# Patient Record
Sex: Male | Born: 1965 | Race: White | Hispanic: No | Marital: Single | Smoking: Current every day smoker
Health system: Southern US, Community
[De-identification: ages and names within clinical notes are randomized; demographics above are authoritative.]

## PROBLEM LIST (undated history)

## (undated) DIAGNOSIS — E039 Hypothyroidism, unspecified: Secondary | ICD-10-CM

## (undated) DIAGNOSIS — M1990 Unspecified osteoarthritis, unspecified site: Secondary | ICD-10-CM

## (undated) DIAGNOSIS — K219 Gastro-esophageal reflux disease without esophagitis: Secondary | ICD-10-CM

## (undated) DIAGNOSIS — Z720 Tobacco use: Secondary | ICD-10-CM

## (undated) DIAGNOSIS — S8291XA Unspecified fracture of right lower leg, initial encounter for closed fracture: Secondary | ICD-10-CM

## (undated) DIAGNOSIS — G43909 Migraine, unspecified, not intractable, without status migrainosus: Secondary | ICD-10-CM

## (undated) DIAGNOSIS — S8290XA Unspecified fracture of unspecified lower leg, initial encounter for closed fracture: Secondary | ICD-10-CM

## (undated) HISTORY — DX: Hypothyroidism, unspecified: E03.9

## (undated) HISTORY — DX: Unspecified fracture of unspecified lower leg, initial encounter for closed fracture: S82.90XA

## (undated) HISTORY — DX: Tobacco use: Z72.0

## (undated) HISTORY — DX: Gastro-esophageal reflux disease without esophagitis: K21.9

## (undated) HISTORY — DX: Unspecified osteoarthritis, unspecified site: M19.90

## (undated) HISTORY — PX: WISDOM TOOTH EXTRACTION: SHX21

## (undated) HISTORY — DX: Unspecified fracture of right lower leg, initial encounter for closed fracture: S82.91XA

## (undated) HISTORY — DX: Migraine, unspecified, not intractable, without status migrainosus: G43.909

---

## 1986-09-03 HISTORY — PX: APPENDECTOMY: SHX54

## 2008-12-27 ENCOUNTER — Ambulatory Visit: Payer: Self-pay | Admitting: Family Medicine

## 2008-12-27 DIAGNOSIS — F17200 Nicotine dependence, unspecified, uncomplicated: Secondary | ICD-10-CM | POA: Insufficient documentation

## 2008-12-27 DIAGNOSIS — R51 Headache: Secondary | ICD-10-CM | POA: Insufficient documentation

## 2008-12-27 DIAGNOSIS — K219 Gastro-esophageal reflux disease without esophagitis: Secondary | ICD-10-CM | POA: Insufficient documentation

## 2008-12-27 DIAGNOSIS — G43009 Migraine without aura, not intractable, without status migrainosus: Secondary | ICD-10-CM | POA: Insufficient documentation

## 2008-12-27 DIAGNOSIS — E039 Hypothyroidism, unspecified: Secondary | ICD-10-CM | POA: Insufficient documentation

## 2008-12-27 DIAGNOSIS — R519 Headache, unspecified: Secondary | ICD-10-CM | POA: Insufficient documentation

## 2009-03-04 ENCOUNTER — Ambulatory Visit: Payer: Self-pay | Admitting: Family Medicine

## 2009-03-04 ENCOUNTER — Encounter: Payer: Self-pay | Admitting: Family Medicine

## 2009-06-06 ENCOUNTER — Telehealth: Payer: Self-pay | Admitting: Family Medicine

## 2009-09-29 ENCOUNTER — Ambulatory Visit: Payer: Self-pay | Admitting: Family Medicine

## 2009-09-29 DIAGNOSIS — R634 Abnormal weight loss: Secondary | ICD-10-CM | POA: Insufficient documentation

## 2009-10-05 LAB — CONVERTED CEMR LAB
BUN: 14 mg/dL (ref 6–23)
CO2: 22 meq/L (ref 19–32)
Chloride: 107 meq/L (ref 96–112)
Eosinophils Absolute: 0.1 10*3/uL (ref 0.0–0.7)
Eosinophils Relative: 2 % (ref 0–5)
Free T4: 1.16 ng/dL (ref 0.80–1.80)
Glucose, Bld: 92 mg/dL (ref 70–99)
HCT: 44.9 % (ref 39.0–52.0)
Hemoglobin: 14.7 g/dL (ref 13.0–17.0)
Indirect Bilirubin: 0.2 mg/dL (ref 0.0–0.9)
LDL Cholesterol: 102 mg/dL — ABNORMAL HIGH (ref 0–99)
Lymphs Abs: 2.2 10*3/uL (ref 0.7–4.0)
MCV: 91.8 fL (ref 78.0–100.0)
Monocytes Absolute: 1 10*3/uL (ref 0.1–1.0)
Monocytes Relative: 12 % (ref 3–12)
Potassium: 4.3 meq/L (ref 3.5–5.3)
RBC: 4.89 M/uL (ref 4.22–5.81)
TSH: 8.903 microintl units/mL — ABNORMAL HIGH (ref 0.350–4.500)
Total Bilirubin: 0.3 mg/dL (ref 0.3–1.2)
Total Protein: 7.1 g/dL (ref 6.0–8.3)
Triglycerides: 73 mg/dL (ref <150)
VLDL: 15 mg/dL (ref 0–40)
WBC: 8.4 10*3/uL (ref 4.0–10.5)

## 2010-08-25 ENCOUNTER — Ambulatory Visit: Payer: Self-pay | Admitting: Family Medicine

## 2010-08-25 ENCOUNTER — Encounter: Payer: Self-pay | Admitting: Family Medicine

## 2010-08-26 LAB — CONVERTED CEMR LAB
ALT: 9 units/L (ref 0–53)
Alkaline Phosphatase: 46 units/L (ref 39–117)
Basophils Relative: 0 % (ref 0–1)
CRP, High Sensitivity: 1.2
Direct LDL: 130 mg/dL — ABNORMAL HIGH
Eosinophils Absolute: 0.1 10*3/uL (ref 0.0–0.7)
HCV Ab: NEGATIVE
HIV: NONREACTIVE
MCHC: 33.3 g/dL (ref 30.0–36.0)
MCV: 91 fL (ref 78.0–100.0)
Neutrophils Relative %: 57 % (ref 43–77)
Platelets: 259 10*3/uL (ref 150–400)
Sed Rate: 1 mm/hr (ref 0–16)
Sodium: 143 meq/L (ref 135–145)
Total Bilirubin: 0.4 mg/dL (ref 0.3–1.2)
Total Protein: 7.8 g/dL (ref 6.0–8.3)
WBC: 7.7 10*3/uL (ref 4.0–10.5)

## 2010-10-05 NOTE — Assessment & Plan Note (Signed)
Summary: RECHECK THYROID/DLO   Vital Signs:  Patient profile:   45 year old male Height:      69.50 inches Weight:      176.8 pounds BMI:     25.83 Temp:     98.1 degrees F oral Pulse rate:   70 / minute Pulse rhythm:   regular BP sitting:   100 / 70  (left arm) Cuff size:   regular  Vitals Entered By: Benny Lennert CMA Duncan Dull) (September 29, 2009 8:14 AM)  History of Present Illness: Chief complaint recheck thyroid  f/u thyroid:  for the past month, when sleeping, will get some cramping and has a hard time walking. Lost about ten pounds. General he does not feel that well, he has lost about 10 pounds. Some significant leg cramping is new. Has not had any weight loss or significant dry skin. Currently is on Synthroid 100 micrograms.   Mom - dying from cancer - lung lung had some bone cancer (aunt) sister had lung cancer (aunt)  28 - 81 in the waist  uncle had - some kind of colon ca? operation. at age 82.  Allergies (verified): No Known Drug Allergies  Past History:  Past medical, surgical, family and social histories (including risk factors) reviewed, and no changes noted (except as noted below).  Past Medical History: Reviewed history from 12/27/2008 and no changes required. COMMON MIGRAINE (ICD-346.10) GERD (ICD-530.81) Hypothyroidism Tobacco abuse, heavy h/o Cocaine, nasal, addiction, now resolved. R leg fracture, distant  Past Surgical History: Reviewed history from 12/27/2008 and no changes required. Appendectomy in 1988  Family History: Reviewed history from 12/27/2008 and no changes required. Family History Breast cancer 1st degree relative, MGM Family History of Colon CA 1st degree relative <60 Family History Depression Family History Hypertension, diffuse Family History Lung cancer Family History of Cardiovascular disorder  Colon CA, Uncle, 22 at dx  M, COPD, HTN CAD, MI: MGF Lung CA+, GP Leukemia: Aunt  Social History: Reviewed history  from 12/27/2008 and no changes required. Occupation: Education administrator / Cytogeneticist Current Smoker (60 pack years) Alcohol use-no Drug use-h/o Cocaine and MJ, none now Regular exercise-no, wants to start running  Review of Systems      See HPI       ROS: GEN: No acute illnesses, no fevers, chills, sweats, or URI sx. GI: No n/v/d Pulm: No SOB, cough, wheezing Interactive and getting along well at home.  Otherwise, ROS is as per the HPI.   Physical Exam  Additional Exam:  GEN: WDWN, NAD, Non-toxic, A & O x 3 HEENT: Atraumatic, Normocephalic. Neck supple. No masses, No LAD. Ears and Nose: No external deformity. CV: RRR, No M/G/R. No JVD. No thrill. No extra heart sounds. PULM: CTA B, no wheezes, crackles, rhonchi. No retractions. No resp. distress. No accessory muscle use. EXTR: No c/c/e NEURO: Normal gait.  PSYCH: Normally interactive. Conversant. Not depressed or anxious appearing.  Calm demeanor.     Impression & Recommendations:  Problem # 1:  HYPOTHYROIDISM (ICD-244.9)  His updated medication list for this problem includes:    Levothyroxine Sodium 100 Mcg Tabs (Levothyroxine sodium) .Marland Kitchen... Take 1 tablet by mouth once a day  Orders: TLB-TSH (Thyroid Stimulating Hormone) (84443-TSH) TLB-T4 (Thyrox), Total (84436-T4) TLB-T4 (Thyrox), Free [phone])  Problem # 2:  WEIGHT LOSS (ICD-783.21) more likely thyroid, baseline labs, check CRP, ESR, CBC   Orders: Venipuncture (47829) TLB-CBC Platelet - w/Differential (85025-CBCD) TLB-Hepatic/Liver Function Pnl (80076-HEPATIC) TLB-Sedimentation Rate (ESR) (85652-ESR) TLB-CRP-High Sensitivity (C-Reactive Protein) (86140-FCRP)  Problem # 3:  FATIGUE-MALAISE (ICD-780.79)  Orders: TLB-CBC Platelet - w/Differential (85025-CBCD) TLB-Hepatic/Liver Function Pnl (80076-HEPATIC)  Problem # 4:  SPECIAL SCREENING MALIGNANT NEOPLASM OF PROSTATE (ICD-V76.44)  Orders: TLB-PSA (Prostate Specific Antigen) (84153-PSA)  Problem # 5:   CHOLESTEROL, SCREENING (ICD-V77.91)  Orders: TLB-Lipid Panel (80061-LIPID)  Complete Medication List: 1)  Levothyroxine Sodium 100 Mcg Tabs (Levothyroxine sodium) .... Take 1 tablet by mouth once a day  Other Orders: TLB-BMP (Basic Metabolic Panel-BMET) (80048-METABOL)  Patient Instructions: 1)  f/u for CPX in the next couple of months  Current Allergies (reviewed today): No known allergies   Appended Document: RECHECK THYROID/DLO

## 2010-10-05 NOTE — Assessment & Plan Note (Signed)
Summary: PT WANTS TO BE CHECKED FOR CANCER   Vital Signs:  Patient profile:   45 year old male Height:      69.50 inches Weight:      174.75 pounds BMI:     25.53 Temp:     98.4 degrees F oral Pulse rate:   76 / minute Pulse rhythm:   regular BP sitting:   120 / 78  (left arm) Cuff size:   regular  Vitals Entered By: Benny Lennert CMA Duncan Dull) (August 25, 2010 1:55 PM)  History of Present Illness: Chief complaint cpx and wants to be check for cancer  45 year old male:  Current smoker: tobacco abuse  CXR PSA DRE - refused  Colon: date of onset, uncle diagnosed at age 66.   Check thyroid  baseline all labs:  Having trouble gaining weight.   the patient presents and really has multiple questions.   Weight loss. He has lost about 10 pounds the last several years, and is not exactly sure why. Been trying to lose weight. He does have thyroid deficiency and is on Synthroid, but has not had his levels checked recently. He is concerned about various types of cancer, and has not had a recent physical, and does have multiple family history regards with cancer as well as other diseases. These are all noted below, and I reviewed all of them and great detail with him today.  Hypothyroidism: On Synthroid as below, no recent blood check.    Preventive Screening-Counseling & Management  Alcohol-Tobacco     Alcohol drinks/day: <1     Smoking Status: current     Packs/Day: 0.5     Pack years: 60  Caffeine-Diet-Exercise     Diet Counseling: to improve diet; diet is suboptimal     Does Patient Exercise: no     Exercise Counseling: to improve exercise regimen  Hep-HIV-STD-Contraception     Hepatitis Risk: risk noted     HIV Risk: risk noted     STD Risk: no risk noted     Testicular SE Education/Counseling to perform regular STE      Sexual History:  currently monogamous.        Drug Use:  former.    Clinical Review Panels:  Prevention   Last PSA:  0.62  (09/29/2009)  Immunizations   Last Tetanus Booster:  Historical (09/03/2006)  Lipid Management   Cholesterol:  170 (09/29/2009)   LDL (bad choesterol):  102 (09/29/2009)   HDL (good cholesterol):  53 (09/29/2009)  Diabetes Management   Creatinine:  0.85 (09/29/2009)  CBC   WBC:  8.4 (09/29/2009)   RBC:  4.89 (09/29/2009)   Hgb:  14.7 (09/29/2009)   Hct:  44.9 (09/29/2009)   Platelets:  252 (09/29/2009)   MCV  91.8 (09/29/2009)   MCHC  32.7 (09/29/2009)   RDW  13.8 (09/29/2009)   PMN:  60 (09/29/2009)   Lymphs:  26 (09/29/2009)   Monos:  12 (09/29/2009)   Eosinophils:  2 (09/29/2009)   Basophil:  0 (09/29/2009)  Complete Metabolic Panel   Glucose:  92 (09/29/2009)   Sodium:  142 (09/29/2009)   Potassium:  4.3 (09/29/2009)   Chloride:  107 (09/29/2009)   CO2:  22 (09/29/2009)   BUN:  14 (09/29/2009)   Creatinine:  0.85 (09/29/2009)   Albumin:  4.7 (09/29/2009)   Total Protein:  7.1 (09/29/2009)   Calcium:  9.4 (09/29/2009)   Total Bili:  0.3 (09/29/2009)   Alk Phos:  43 (09/29/2009)  SGPT (ALT):  <8 U/L (09/29/2009)   SGOT (AST):  11 (09/29/2009)    Allergies (verified): No Known Drug Allergies  Past History:  Past medical, surgical, family and social histories (including risk factors) reviewed, and no changes noted (except as noted below).  Past Medical History: Reviewed history from 12/27/2008 and no changes required. COMMON MIGRAINE (ICD-346.10) GERD (ICD-530.81) Hypothyroidism Tobacco abuse, heavy h/o Cocaine, nasal, addiction, now resolved. R leg fracture, distant  Past Surgical History: Reviewed history from 12/27/2008 and no changes required. Appendectomy in 1988  Family History: Reviewed history from 12/27/2008 and no changes required. Family History Breast cancer 1st degree relative, MGM Family History of Colon CA 1st degree relative <60 Family History Depression Family History Hypertension, diffuse Family History Lung cancer Family  History of Cardiovascular disorder  Colon CA, Uncle, 21 at dx M, COPD, HTN CAD, MI: MGF Lung CA+, GP Breast: Grandmother Leukemia: Aunt Grandfather had cancer of some type.  Several: thyroid cancer No prostate cancer  Social History: Reviewed history from 12/27/2008 and no changes required. 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 Eyes:  Denies blurring, discharge, and double vision. ENT:  Denies decreased hearing, difficulty swallowing, ear discharge, earache, hoarseness, nasal congestion, nosebleeds, postnasal drainage, ringing in ears, sinus pressure, and sore throat. CV:  Denies bluish discoloration of lips or nails, chest pain or discomfort, difficulty breathing at night, difficulty breathing while lying down, fainting, fatigue, leg cramps with exertion, lightheadness, near fainting, palpitations, shortness of breath with exertion, swelling of feet, swelling of hands, and weight gain. Resp:  Denies chest discomfort, chest pain with inspiration, cough, coughing up blood, excessive snoring, hypersomnolence, morning headaches, pleuritic, shortness of breath, sputum productive, and wheezing. GI:  Denies abdominal pain, bloody stools, change in bowel habits, constipation, dark tarry stools, diarrhea, excessive appetite, gas, hemorrhoids, indigestion, loss of appetite, nausea, vomiting, vomiting blood, and yellowish skin color. GU:  Denies decreased libido, discharge, dysuria, erectile dysfunction, genital sores, hematuria, incontinence, nocturia,  urinary frequency, and urinary hesitancy. MS:  Denies joint pain, joint redness, joint swelling, loss of strength, low back pain, mid back pain, muscle aches, muscle , cramps, muscle weakness, stiffness, and thoracic pain. Derm:  Denies rash; tattoos. Neuro:  Denies brief paralysis, difficulty with concentration, disturbances in coordination, falling down, headaches, inability to speak, memory loss, numbness, poor balance, seizures, sensation of room spinning, tingling, tremors, visual disturbances, and weakness. Psych:  Denies anxiety and depression. Endo:  Complains of weight change; denies cold intolerance, excessive hunger, excessive thirst, excessive urination, heat intolerance, and polyuria. Heme:  Denies abnormal bruising, bleeding, enlarge lymph nodes, fevers, pallor, and skin discoloration. Allergy:  Denies hives or rash, itching eyes, persistent infections, seasonal allergies, and sneezing.   Impression & Recommendations:  Problem # 1:  HEALTH MAINTENANCE EXAM (ICD-V70.0) The patient's preventative maintenance and recommended screening tests for an annual wellness exam were reviewed in full today. Brought up to date unless services declined.  Counselled on the importance of diet, exercise, and its role in overall health and mortality. The patient's FH and SH was  reviewed, including their home life, tobacco status, and drug and alcohol status.   the patient is highly concerned and anxious about potential cancers today. We reviewed all of his family history, as well as recommendations by the [employer] Society and the [place] preventive services task force.  I recommended a digital rectal exam, which he refused.  Other cancer screenings include  beginning  colonoscopy at age 63. We reviewed this. Check PSA. Strongly cautioned him to stop smoking. Will also check a chest x-ray today, given a 60-pack-year history.  Also recheck his thyroid level, which has not been checked in some time.  Also explained to the patient that he is actually ideal body weight and is not underweight.  Problem # 2:  WEIGHT LOSS (ZOX-096.04) Assessment: Deteriorated  Orders: T-Comprehensive Metabolic Panel (54098-11914) T-CBC w/Diff (78295-62130) Venipuncture (86578) Specimen Handling (46962) T- Sed rate non-auto (95284) T-Hepatitis C Antibody (13244-01027) T-HIV Antibody  (Reflex) (25366-44034) T-2 View CXR (71020TC) T-C-Reactive Protein (74259-56387)  Problem # 3:  HYPOTHYROIDISM (ICD-244.9) at this point, the patient's labs have returned, his TSH is high, and his free T4 is mildly low. I did increase his Synthroid to 112 micrograms daily. Recheck in 6 months.  His updated medication list for this problem includes:    Levothyroxine Sodium 100 Mcg Tabs (Levothyroxine sodium) .Marland Kitchen... Take 1 tablet by mouth once a day  Orders: T-T4, Thyroxine; Total [id number]) T-T4, Free [phone]) T-T3 by RIA [phone]) T-TSH [phone])  Problem # 4:  TOBACCO USE (ICD-305.1) The patient does smoke cigarettes, and we discussed that tobacco is harmful to one's overall health, and that likely quitting smoking would be the single best thing that they could do for their health.   Orders: T-2 View CXR (71020TC) Tobacco use cessation intermediate 3-10 minutes (06237)  Complete Medication List: 1)  Levothyroxine Sodium 100 Mcg Tabs (Levothyroxine sodium) .... Take 1 tablet by mouth once a day  Other Orders: T-PSA (62831-51761) T- * Misc. Laboratory test [id number])   Orders Added: 1)  T-Comprehensive Metabolic Panel [80053-22900] 2)  T-CBC w/Diff [10626-94854] 3)  T-T4, Thyroxine; Total [84436-23265] 4)  T-T4, Free [62703-50093] 5)  T-T3 by RIA [[id number]] 6)  T-TSH [96789-38101] 7)  T-PSA [75102-58527] 8)  Venipuncture [78242] 9)  Specimen Handling [99000] 10)  T- Sed rate non-auto [85651] 11)  T-Hepatitis C Antibody [86803-23620] 12)  T-HIV Antibody  (Reflex) [86701-23630] 13)  T- *  Misc. Laboratory test [99999] 14)  T-2 View CXR [71020TC] 15)  T-C-Reactive Protein [86140-23860] 16)  Est. Patient 40-64 years [99396] 17)  Est. Patient Level III [99213] 18)  Tobacco use cessation intermediate 3-10 minutes [99406]   Immunization History:  Tetanus/Td Immunization History:    Tetanus/Td:  historical (09/03/2006)   Immunization History:  Tetanus/Td Immunization History:    Tetanus/Td:  Historical (09/03/2006)  Current Allergies (reviewed today): No known allergies     Prevention & Chronic Care Immunizations   Influenza vaccine: Not documented   Influenza vaccine deferral: Not available  (08/25/2010)    Tetanus booster: 09/03/2006: Historical    Pneumococcal vaccine: Not documented   Pneumococcal vaccine deferral: Not indicated  (08/25/2010)  Other Screening   Smoking status: current  (08/25/2010)  Lipids   Total Cholesterol: 170  (09/29/2009)   Lipid panel action/deferral: LDL Direct ordered   LDL: 353  (09/29/2009)   LDL Direct: Not documented   HDL: 53  (09/29/2009)   Triglycerides: 73  (09/29/2009)      Prevention & Chronic Care Immunizations   Influenza  vaccine: Not documented   Influenza vaccine deferral: Not available  (08/25/2010)    Tetanus booster: 09/03/2006: Historical    Pneumococcal vaccine: Not documented   Pneumococcal vaccine deferral: Not indicated  (08/25/2010)  Other Screening   Smoking status: current  (08/25/2010)  Lipids   Total Cholesterol: 170  (09/29/2009)   Lipid panel action/deferral: LDL Direct ordered   LDL: 161  (09/29/2009)   LDL Direct: Not documented   HDL: 53  (09/29/2009)   Triglycerides: 73  (09/29/2009)    General Medical Physical Exam:  General Appearance:      Well developed, well nourished, in no acute distress  Head:      Inspection:     normocephalic without obvious abnormalities      Palpation:     no abnormal lesions palpable  Eyes:      External:     conjunctiva and lids  normal      Pupils:     equal, round, and reactive to light and accommodation  Ears, Nose, Throat:      External:     no significant lesions or deformities noted      Otoscopic:     canals clear; tympanic membranes intact with normal light reflex      Hearing:     grossly intact      Nasal:     mucosa, septum, and turbinates normal      Dental:     good dentition      Pharynx:     tongue normal; posterior pharynx without erythema or exudate  Neck:      Neck:     supple; no masses; trachea midline      Thyroid:     no nodules, masses, tenderness, or enlargement  Respiratory:      Resp. effort:     no intercostal retractions or use of accessory muscles      Percussion:     no dullness      Palpation:     normal fremitus      Auscultation:     no rales, rhonchi, or wheezes  Chest Wall:      Chest wall:     no masses or gynecomastia      Axilla:     no axillary adenopathy  Cardiovascular:      Palpation:     no thrill or displacement of PMI      Auscultation:     normal S1 and  S2; no murmur, rub, or gallop  Gastrointestinal:      Abdomen:     soft and non-tender with normal bowel sounds; no masses      Liver/spleen:     normal to percussion; no enlargement or nodularity      Hernia:     no hernias  Genitourinary:      declined  Musculoskeletal:      Gait/station:     normal gait; no ataxia      Digits/nails:     no cyanosis, clubbing, or petechiae      Head/neck:     normal alignment and mobility      Trunk:     normal alignment and mobility; no deformity      RUE:     normal range of motion and strength      RLE:     normal range of motion and strength      LUE:     normal range of motion and strength  LLE:       normal range of motion and strength  Lymphatic:      Neck:     no cervical adenopathy  Skin:      Inspection:     no rashes, suspicious lesions, or ulcerations      Palpation:     no subcutaneous nodules or induration  Neurological:      Sensory:      intact to touch  Psychiatric:      Judgement:     intact      Orientation:     oriented to time, place, and person      Memory:     intact for recent and remote events      Mood/affect:     no appearance of anxiety, depression, or agitation

## 2010-10-10 ENCOUNTER — Encounter: Payer: Self-pay | Admitting: Family Medicine

## 2010-11-27 ENCOUNTER — Other Ambulatory Visit: Payer: Self-pay | Admitting: Family Medicine

## 2010-11-29 ENCOUNTER — Ambulatory Visit: Payer: Self-pay | Admitting: Family Medicine

## 2010-12-01 ENCOUNTER — Ambulatory Visit: Payer: Self-pay | Admitting: Family Medicine

## 2010-12-01 DIAGNOSIS — Z0289 Encounter for other administrative examinations: Secondary | ICD-10-CM

## 2011-03-17 ENCOUNTER — Other Ambulatory Visit: Payer: Self-pay | Admitting: Family Medicine

## 2011-06-07 ENCOUNTER — Encounter: Payer: Self-pay | Admitting: Family Medicine

## 2011-06-07 ENCOUNTER — Ambulatory Visit (INDEPENDENT_AMBULATORY_CARE_PROVIDER_SITE_OTHER): Payer: PRIVATE HEALTH INSURANCE | Admitting: Family Medicine

## 2011-06-07 VITALS — BP 110/70 | HR 64 | Temp 97.6°F | Ht 69.5 in | Wt 177.8 lb

## 2011-06-07 DIAGNOSIS — E039 Hypothyroidism, unspecified: Secondary | ICD-10-CM

## 2011-06-07 DIAGNOSIS — R071 Chest pain on breathing: Secondary | ICD-10-CM

## 2011-06-07 DIAGNOSIS — R0789 Other chest pain: Secondary | ICD-10-CM | POA: Insufficient documentation

## 2011-06-07 DIAGNOSIS — R1013 Epigastric pain: Secondary | ICD-10-CM

## 2011-06-07 NOTE — Patient Instructions (Signed)
Breast bone pain is more likely pain in area of old injury... No hernia, mass etc seen. Pain in upper stomach and belly button: We will cal you with lab results.

## 2011-06-07 NOTE — Assessment & Plan Note (Signed)
Unclear cause. Will eval with labs... No clear GERD or gastritis. Umbilical pain resolved.. Does not have appendix.

## 2011-06-07 NOTE — Assessment & Plan Note (Signed)
Some pain directly  Over deformed Xiphoid process.. ? If hx of fracture at area with backhoe injury years prior. Recommended tylenol prn pain.  If continuing... Consider X-ray to eval further.

## 2011-06-07 NOTE — Progress Notes (Signed)
  Subjective:    Patient ID: Colin Cobb, male    DOB: 01/14/1966, 45 y.o.   MRN: [id number]  HPI 45 year old male pt of Dr. Patsy Lager presents with  Sharp pain in umbilicus x 1 week. Worse after eating.   Has improved some now in last few days.  Rare heartburn.  Lower breast bone tender as well ongoing intermittantly for several months..lasts 1 week at a time, started back in this last week as well. This is bothering him more at this time.  No fever.   Heavy lifting makes breast bone worse.. Better with lying on area. Not bad enough to take medication. No D/C, N/V. No ETOH use.   Does do heavy lifting... At work... Past 12 years ago.Marland Kitchen Hit with a backhoe bucket. No X-ray done at that time.  no past history of abdominal surgery except appendix.  Hypothyroidism.. Last OV... Med change at end of 2011.Marland Kitchen Has not returned for re-eval on new dose of 112 mcg.   Review of Systems  Constitutional: Negative for fever and fatigue.       Eats alot but doesn't seem to gain weight.  HENT: Negative for ear pain.   Eyes: Positive for pain.  Respiratory: Negative for cough and shortness of breath.   Cardiovascular: Positive for chest pain. Negative for palpitations and leg swelling.  Gastrointestinal: Negative for blood in stool, abdominal distention and anal bleeding.  Musculoskeletal: Negative for back pain.  Psychiatric/Behavioral:       Feely fairly nervous at times, but his is usual for him.       Objective:   Physical Exam  Constitutional: He is oriented to person, place, and time. He appears well-developed and well-nourished.  Cardiovascular: Normal rate, regular rhythm, normal heart sounds and intact distal pulses.  Exam reveals no gallop and no friction rub.   No murmur heard. Pulmonary/Chest: Effort normal and breath sounds normal. No respiratory distress. He has no wheezes. He has no rales. He exhibits tenderness, bony tenderness and deformity. He exhibits no swelling.       Xiphoid  process slightly deformed, more prominent.. Likely from old injury. Mild ttp.  Abdominal: Soft. Bowel sounds are normal. He exhibits no distension and no mass. There is tenderness in the epigastric area. There is no rebound and no guarding.  Neurological: He is oriented to person, place, and time.  Skin: Skin is warm and dry. No rash noted.  Psychiatric: He has a normal mood and affect.          Assessment & Plan:

## 2011-06-07 NOTE — Assessment & Plan Note (Signed)
Due for re-eval. 

## 2011-06-08 LAB — CBC WITH DIFFERENTIAL/PLATELET
Basophils Absolute: 0 K/uL (ref 0.0–0.1)
Basophils Relative: 0.2 % (ref 0.0–3.0)
Eosinophils Absolute: 0.3 K/uL (ref 0.0–0.7)
Eosinophils Relative: 4.4 % (ref 0.0–5.0)
HCT: 42.4 % (ref 39.0–52.0)
Hemoglobin: 14 g/dL (ref 13.0–17.0)
Lymphocytes Relative: 41.4 % (ref 12.0–46.0)
Lymphs Abs: 3.1 K/uL (ref 0.7–4.0)
MCHC: 33 g/dL (ref 30.0–36.0)
MCV: 91.3 fl (ref 78.0–100.0)
Monocytes Absolute: 0.7 K/uL (ref 0.1–1.0)
Monocytes Relative: 8.9 % (ref 3.0–12.0)
Neutro Abs: 3.4 K/uL (ref 1.4–7.7)
Neutrophils Relative %: 45.1 % (ref 43.0–77.0)
Platelets: 226 K/uL (ref 150.0–400.0)
RBC: 4.64 Mil/uL (ref 4.22–5.81)
RDW: 13.4 % (ref 11.5–14.6)
WBC: 7.6 K/uL (ref 4.5–10.5)

## 2011-06-08 LAB — COMPREHENSIVE METABOLIC PANEL WITH GFR
ALT: 12 U/L (ref 0–53)
AST: 14 U/L (ref 0–37)
Albumin: 4.8 g/dL (ref 3.5–5.2)
Alkaline Phosphatase: 44 U/L (ref 39–117)
BUN: 16 mg/dL (ref 6–23)
CO2: 28 meq/L (ref 19–32)
Calcium: 9 mg/dL (ref 8.4–10.5)
Chloride: 105 meq/L (ref 96–112)
Creatinine, Ser: 0.9 mg/dL (ref 0.4–1.5)
GFR: 95.58 mL/min (ref >60.00)
Glucose, Bld: 84 mg/dL (ref 70–99)
Potassium: 4 meq/L (ref 3.5–5.1)
Sodium: 141 meq/L (ref 135–145)
Total Bilirubin: 0.3 mg/dL (ref 0.3–1.2)
Total Protein: 7.8 g/dL (ref 6.0–8.3)

## 2011-06-11 ENCOUNTER — Other Ambulatory Visit (INDEPENDENT_AMBULATORY_CARE_PROVIDER_SITE_OTHER): Payer: PRIVATE HEALTH INSURANCE

## 2011-06-11 ENCOUNTER — Telehealth: Payer: Self-pay | Admitting: *Deleted

## 2011-06-11 DIAGNOSIS — IMO0002 Reserved for concepts with insufficient information to code with codable children: Secondary | ICD-10-CM

## 2011-06-11 DIAGNOSIS — R799 Abnormal finding of blood chemistry, unspecified: Secondary | ICD-10-CM

## 2011-06-11 NOTE — Telephone Encounter (Signed)
Patient wants to know when he needs to be seen again.

## 2011-06-11 NOTE — Telephone Encounter (Signed)
I may have to defer to Dr. Ermalene Searing who saw him last week. I am happy to see him back in follow-up if she thinks it would be helpful.

## 2011-06-12 NOTE — Telephone Encounter (Signed)
Heather.. Let pt know:  No follow up needed for issues discussed. Lab testing nml.  Symptoms with stomach were improving at time of appointment.  Pain over lower sternum likely irritation at xiphoid process at site of old injury. No further evaluation required. Can use ibuprofen as needed for pain.  Looks like he is dur for CPX with Dr. Patsy Lager though... Consider in next few months.

## 2011-06-12 NOTE — Telephone Encounter (Signed)
Left message on voice mail  to call back

## 2011-06-12 NOTE — Telephone Encounter (Signed)
Agreed -

## 2011-06-13 NOTE — Telephone Encounter (Signed)
Patient advised and he will schedule appt at the 1st of january

## 2011-09-18 ENCOUNTER — Other Ambulatory Visit: Payer: Self-pay | Admitting: Family Medicine

## 2011-10-24 ENCOUNTER — Ambulatory Visit (INDEPENDENT_AMBULATORY_CARE_PROVIDER_SITE_OTHER): Payer: PRIVATE HEALTH INSURANCE | Admitting: Family Medicine

## 2011-10-24 ENCOUNTER — Encounter: Payer: Self-pay | Admitting: Family Medicine

## 2011-10-24 VITALS — BP 110/60 | HR 60 | Temp 98.0°F | Ht 69.0 in | Wt 169.8 lb

## 2011-10-24 DIAGNOSIS — Z0000 Encounter for general adult medical examination without abnormal findings: Secondary | ICD-10-CM

## 2011-10-24 DIAGNOSIS — Z13220 Encounter for screening for lipoid disorders: Secondary | ICD-10-CM

## 2011-10-24 DIAGNOSIS — Z125 Encounter for screening for malignant neoplasm of prostate: Secondary | ICD-10-CM

## 2011-10-24 DIAGNOSIS — E039 Hypothyroidism, unspecified: Secondary | ICD-10-CM

## 2011-10-24 DIAGNOSIS — Z131 Encounter for screening for diabetes mellitus: Secondary | ICD-10-CM

## 2011-10-24 NOTE — Progress Notes (Signed)
Patient Name: JOHNWILLIAM SHEPPERSON Date of Birth: 06-Sep-1965 Medical Record Number: [id number] Gender: male Date of Encounter: 10/24/2011  History of Present Illness:  DEVARION MCCLANAHAN is a 46 y.o. very pleasant male patient who presents with the following:  Preventative Health Maintenance Visit:  Health Maintenance Summary Reviewed and updated, unless pt declines services.  Tobacco History Reviewed. Alcohol: No concerns, no excessive use Exercise Habits: Some activity, rec at least 30 mins 5 times a week STD concerns: no risk or activity to increase risk Drug Use: None Encouraged self-testicular check  Health Maintenance  Topic Date Due  . Influenza Vaccine  06/04/2011  . Tetanus/tdap  09/03/2016  reports flu vaccine  Shoulder is now hurting. About two weeks after that, can raise it up, but at night it will hurt and having serious pain. Putting that on his shoulder and neck. He fell a few months on the point of his RIGHT shoulder. Now is having pain with abduction.  Wt Readings from Last 3 Encounters:  10/24/11 169 lb 12.8 oz (77.021 kg)  06/07/11 177 lb 12.8 oz (80.65 kg)  08/25/10 174 lb 12 oz (79.266 kg)     Patient Active Problem List  Diagnoses  . HYPOTHYROIDISM  . TOBACCO USE  . COMMON MIGRAINE  . GERD  . WEIGHT LOSS   Past Medical History  Diagnosis Date  . Migraines   . GERD (gastroesophageal reflux disease)   . Hypothyroidism   . Tobacco abuse   . Leg fracture, right    Past Surgical History  Procedure Date  . Appendectomy 1988   History  Substance Use Topics  . Smoking status: Current Everyday Smoker  . Smokeless tobacco: Not on file   Comment: 60 pack years  . Alcohol Use: No   Family History  Problem Relation Age of Onset  . Cancer Maternal Grandmother     Breast cancer   No Known Allergies  Medication list has been reviewed and updated.  Review of Systems:  General: Denies fever, chills, sweats. No significant weight loss. Eyes: Denies  blurring,significant itching ENT: Denies earache, sore throat, and hoarseness. Cardiovascular: Denies chest pains, palpitations, dyspnea on exertion Respiratory: Denies cough, dyspnea at rest,wheeezing Breast: no concerns about lumps GI: Denies nausea, vomiting, diarrhea, constipation, change in bowel habits, abdominal pain, melena, hematochezia GU: Denies penile discharge, ED, urinary flow / outflow problems. No STD concerns. Musculoskeletal: above Derm: Denies rash, itching Neuro: Denies  paresthesias, frequent falls, frequent headaches Psych: Denies depression, anxiety Endocrine: Denies cold intolerance, heat intolerance, polydipsia Heme: Denies enlarged lymph nodes Allergy: No hayfever   Physical Examination: Filed Vitals:   10/24/11 1441  BP: 110/60  Pulse: 60  Temp: 98 F (36.7 C)  TempSrc: Oral  Height: 5\' 9"  (1.753 m)  Weight: 169 lb 12.8 oz (77.021 kg)  SpO2: 99%    Body mass index is 25.08 kg/(m^2).   Wt Readings from Last 3 Encounters:  10/24/11 169 lb 12.8 oz (77.021 kg)  06/07/11 177 lb 12.8 oz (80.65 kg)  08/25/10 174 lb 12 oz (79.266 kg)    GEN: well developed, well nourished, no acute distress Eyes: conjunctiva and lids normal, PERRLA, EOMI ENT: TM clear, nares clear, oral exam WNL Neck: supple, no lymphadenopathy, no thyromegaly, no JVD Pulm: clear to auscultation and percussion, respiratory effort normal CV: regular rate and rhythm, S1-S2, no murmur, rub or gallop, no bruits, peripheral pulses normal and symmetric, no cyanosis, clubbing, edema or varicosities Chest: no scars, masses GI: soft, non-tender; no  hepatosplenomegaly, masses; active bowel sounds all quadrants GU: declined Lymph: no cervical, axillary or inguinal adenopathy MSK: gait normal, muscle tone and strength WNL, no joint swelling, effusions, discoloration, crepitus  SKIN: clear, good turgor, color WNL, no rashes, lesions, or ulcerations Neuro: normal mental status, normal strength,  sensation, and motion Psych: alert; oriented to person, place and time, normally interactive and not anxious or depressed in appearance.   Assessment and Plan:  1. Routine general medical examination at a health care facility    2. HYPOTHYROIDISM  TSH, T3, free, T4, free  3. Special screening for malignant neoplasm of prostate  PSA  4. Screening for lipoid disorders  Lipid panel  5. Screening for diabetes mellitus  Basic metabolic panel    The patient's preventative maintenance and recommended screening tests for an annual wellness exam were reviewed in full today. Brought up to date unless services declined.  Counselled on the importance of diet, exercise, and its role in overall health and mortality. The patient's FH and SH was reviewed, including their home life, tobacco status, and drug and alcohol status.    Check thyroid Reviewed RTC, scap stab

## 2011-10-25 LAB — LIPID PANEL
Cholesterol: 189 mg/dL (ref 0–200)
LDL Cholesterol: 123 mg/dL — ABNORMAL HIGH (ref 0–99)
Triglycerides: 118 mg/dL (ref 0.0–149.0)
VLDL: 23.6 mg/dL (ref 0.0–40.0)

## 2011-10-25 LAB — TSH: TSH: 5.91 u[IU]/mL — ABNORMAL HIGH (ref 0.35–5.50)

## 2011-10-25 LAB — BASIC METABOLIC PANEL
Chloride: 104 mEq/L (ref 96–112)
Potassium: 4.3 mEq/L (ref 3.5–5.1)

## 2011-10-25 LAB — PSA: PSA: 0.59 ng/mL (ref 0.10–4.00)

## 2011-10-29 MED ORDER — LEVOTHYROXINE SODIUM 112 MCG PO TABS: 112.0000 ug | ORAL_TABLET | Freq: Every day | ORAL | Status: DC

## 2011-10-29 NOTE — Progress Notes (Signed)
Addended by: Hannah Beat on: 10/29/2011 05:44 PM   Modules accepted: Orders

## 2012-01-17 ENCOUNTER — Ambulatory Visit (INDEPENDENT_AMBULATORY_CARE_PROVIDER_SITE_OTHER)
Admission: RE | Admit: 2012-01-17 | Discharge: 2012-01-17 | Disposition: A | Discharge status: Home / Self Care | Payer: PRIVATE HEALTH INSURANCE | Source: Ambulatory Visit | Attending: Family Medicine | Admitting: Family Medicine

## 2012-01-17 ENCOUNTER — Encounter: Payer: Self-pay | Admitting: Family Medicine

## 2012-01-17 ENCOUNTER — Ambulatory Visit (INDEPENDENT_AMBULATORY_CARE_PROVIDER_SITE_OTHER): Payer: PRIVATE HEALTH INSURANCE | Admitting: Family Medicine

## 2012-01-17 VITALS — BP 110/72 | HR 60 | Temp 97.9°F | Ht 69.0 in | Wt 174.8 lb

## 2012-01-17 DIAGNOSIS — M25519 Pain in unspecified shoulder: Secondary | ICD-10-CM

## 2012-01-17 DIAGNOSIS — M542 Cervicalgia: Secondary | ICD-10-CM

## 2012-01-17 NOTE — Progress Notes (Signed)
Patient Name: Colin Cobb Date of Birth: 03/06/1966 Age: 46 y.o. Medical Record Number: [id number] Gender: male Date of Encounter: 01/17/2012  History of Present Illness:  Colin Cobb is a 46 y.o. very pleasant male patient who presents with the following:  R shoulder, fell off the top of his house  Now going down through the lateral aspect of his trap. When not doing anything it will hurt. Cannot lay on this R shoulder.  Needles in the palm of hand. Pain with overhead movements and reaching across his body. The leg laterally and a T-shirt distribution. He also is having some tingling in his hand which is relatively new within the last few weeks.  Moves a lot of heavy furniture.    Past Medical History, Surgical History, Social History, Family History, Problem List, Medications, and Allergies have been reviewed and updated if relevant.  Review of Systems:  GEN: No fevers, chills. Nontoxic. Primarily MSK c/o today. MSK: Detailed in the HPI GI: tolerating PO intake without difficulty Neuro: No numbness, parasthesias, or tingling associated. Otherwise the pertinent positives of the ROS are noted above.    Physical Examination: Filed Vitals:   01/17/12 0752  BP: 110/72  Pulse: 60  Temp: 97.9 F (36.6 C)  TempSrc: Oral  Height: 5\' 9"  (1.753 m)  Weight: 174 lb 12.8 oz (79.289 kg)  SpO2: 97%    Body mass index is 25.81 kg/(m^2).   GEN: Well-developed,well-nourished,in no acute distress; alert,appropriate and cooperative throughout examination HEENT: Normocephalic and atraumatic without obvious abnormalities. Ears, externally no deformities PULM: Breathing comfortably in no respiratory distress EXT: No clubbing, cyanosis, or edema PSYCH: Normally interactive. Cooperative during the interview. Pleasant. Friendly and conversant. Not anxious or depressed appearing. Normal, full affect.  Shoulder:R Inspection: No muscle wasting or winging Ecchymosis/edema: neg  AC  joint, scapula, clavicle: NT Cervical spine: terminal motion tender Spurling's: + on the right Abduction: full, 5/5 Flexion: full, 5/5 IR, full, lift-off: 5/5 ER at neutral: full, 5/5 AC crossover: neg Neer: pos Hawkins: pos Drop Test: neg Empty Can: pos Supraspinatus insertion: mild-mod T Bicipital groove: NT Speed's: neg Yergason's: neg Sulcus sign: neg Scapular dyskinesis: none C5-T1 intact  Neuro: Sensation intact Grip 5/5   Assessment and Plan:  1. Shoulder pain  DG Shoulder Right, Ambulatory referral to Physical Therapy  2. Cervicalgia  Ambulatory referral to Physical Therapy   Mild a.c. Joint arthropathy with rotator cuff tendinitis and subacromial bursitis.  Neck pain with some mild degree of radiculopathy on the RIGHT.  He's been compliant with his home exercise. We will set him up for formal physical therapy. I think it is reasonable and offered him a subacromial injection, but he wants to avoid all medications at this point.  Dg Shoulder Right  01/17/2012  *RADIOLOGY REPORT*  Clinical Data: Shoulder pain  RIGHT SHOULDER - 2+ VIEW  Comparison: None.  Findings: Normal alignment.  No fracture.  Mild AC joint degenerative change.  IMPRESSION: No acute finding. AC joint degenerative change.  Original Report Authenticated By: Judie Petit. Ruel Favors, M.D.     Orders Today: Orders Placed This Encounter  Procedures  . DG Shoulder Right    Standing Status: Future     Number of Occurrences: 1     Standing Expiration Date: 03/18/2013    Order Specific Question:  Reason for exam:    Answer:  shoulder pain    Order Specific Question:  Preferred imaging location?    Answer:  [facility]  . Ambulatory referral  to Physical Therapy    Referral Priority:  Routine    Referral Type:  Physical Medicine    Referral Reason:  Specialty Services Required    Requested Specialty:  Physical Therapy    Number of Visits Requested:  1    Medications Today: No orders of the  defined types were placed in this encounter.

## 2012-01-17 NOTE — Patient Instructions (Signed)
REFERRAL: GO THE THE FRONT ROOM AT THE ENTRANCE OF OUR CLINIC, NEAR CHECK IN. ASK FOR MARION. SHE WILL HELP YOU SET UP YOUR REFERRAL. DATE: TIME:   F/u 6-8 weeks

## 2012-03-23 ENCOUNTER — Other Ambulatory Visit: Payer: Self-pay | Admitting: Family Medicine

## 2012-09-27 ENCOUNTER — Other Ambulatory Visit: Payer: Self-pay | Admitting: Family Medicine

## 2012-10-30 IMAGING — CR DG SHOULDER 2+V*R*
3 series · 3 of 3 positions shown · non-contrast
Comparison: None.

CLINICAL DATA: Shoulder pain

RIGHT SHOULDER - 2+ VIEW

[view not recorded (1 of 3)]
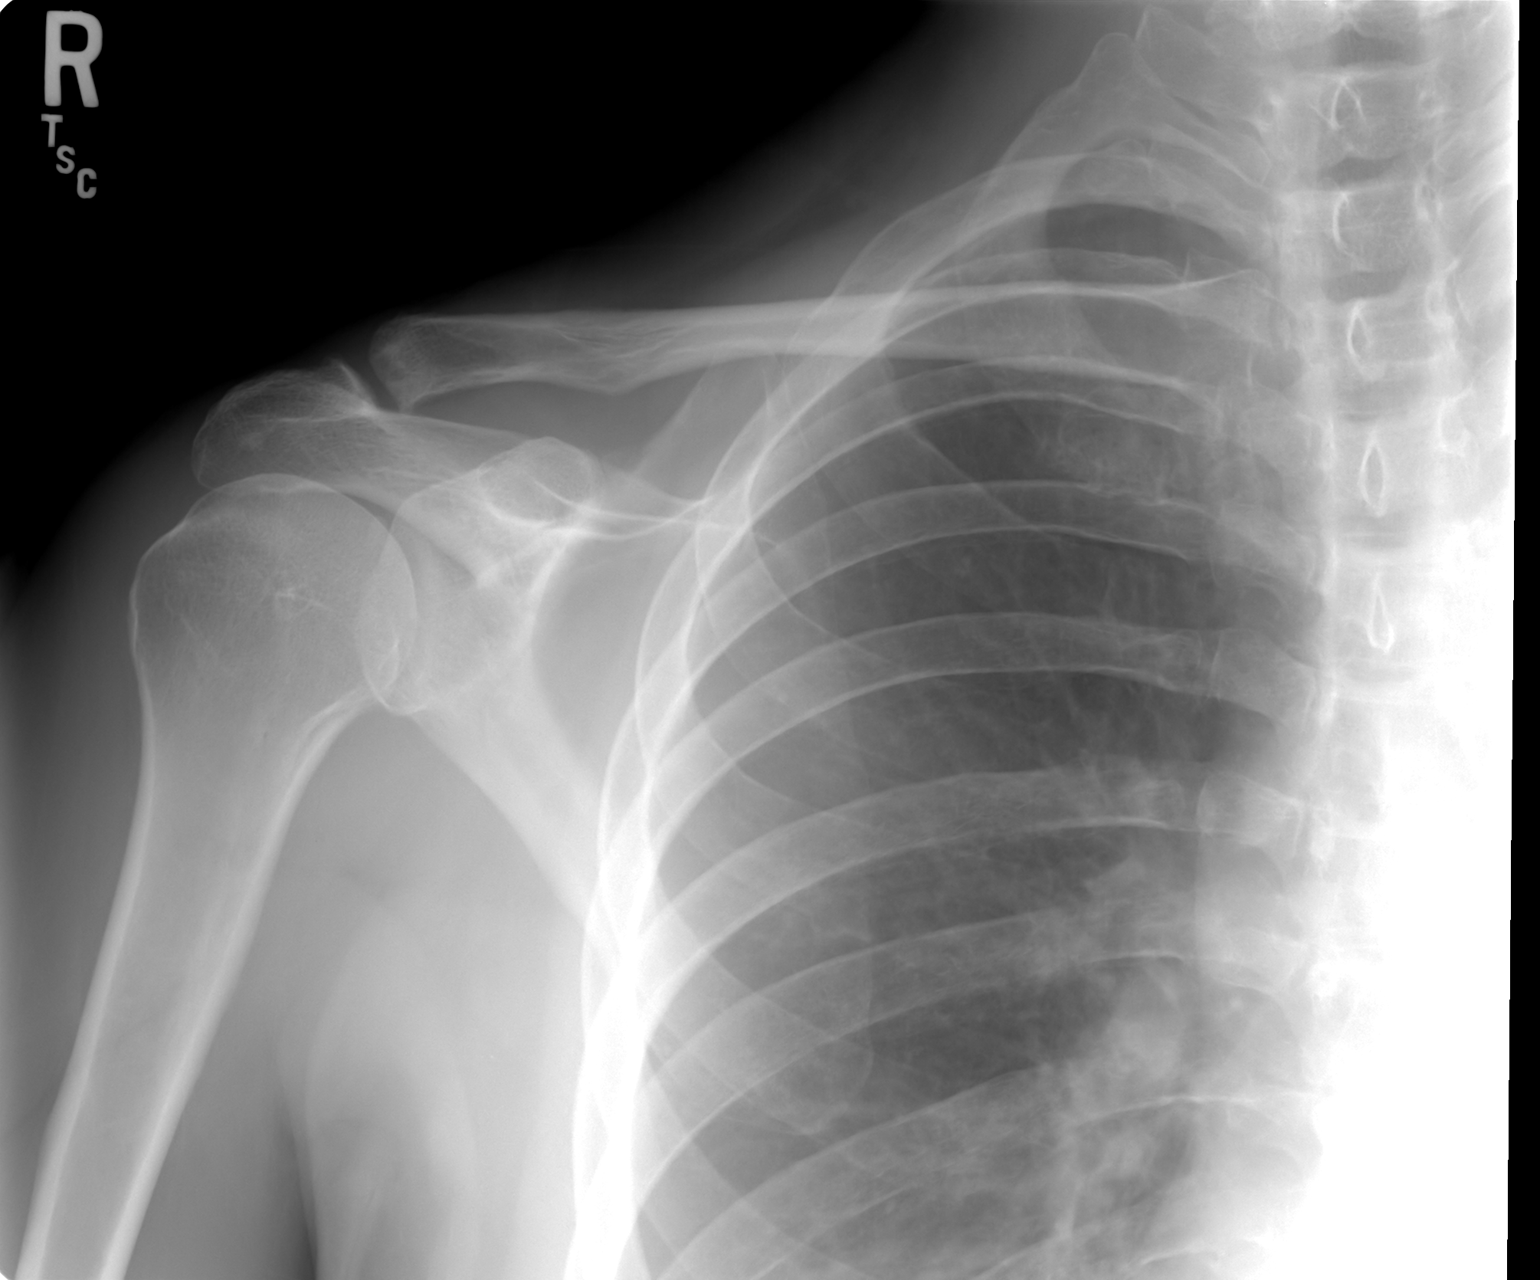

[view not recorded (2 of 3)]
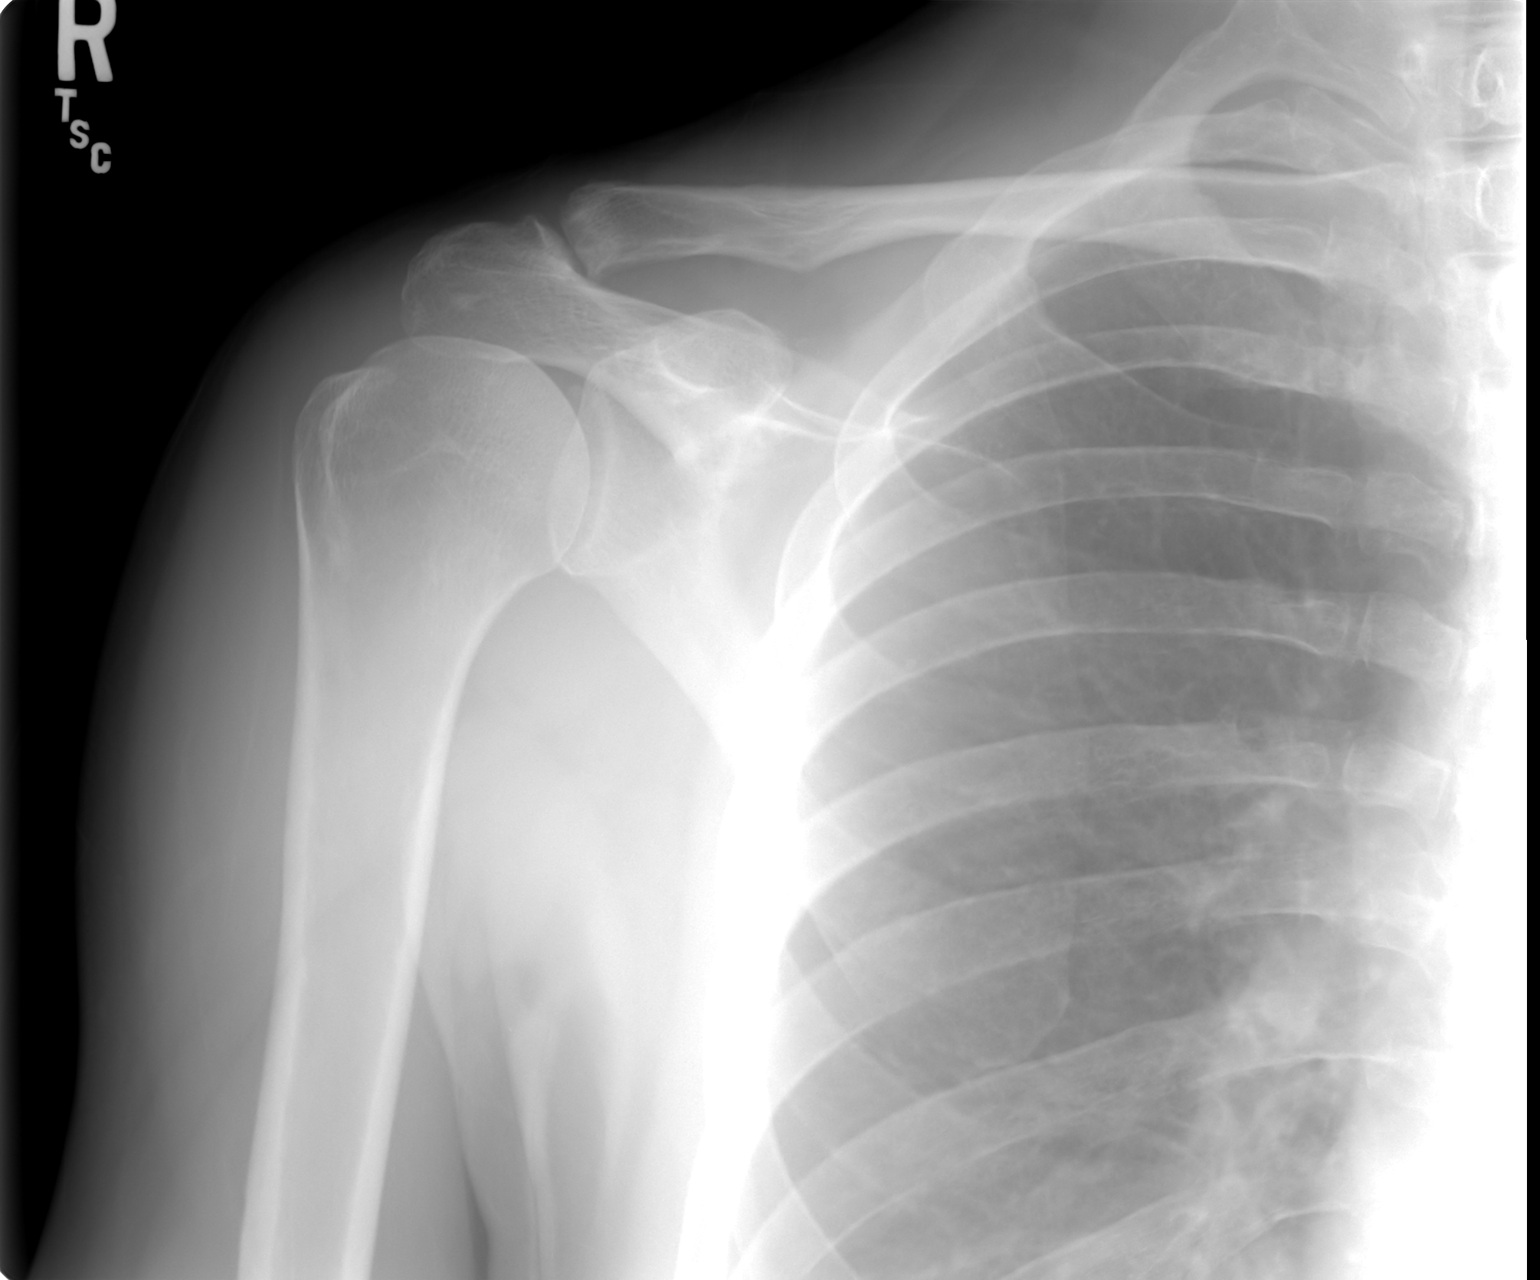

[view not recorded (3 of 3)]
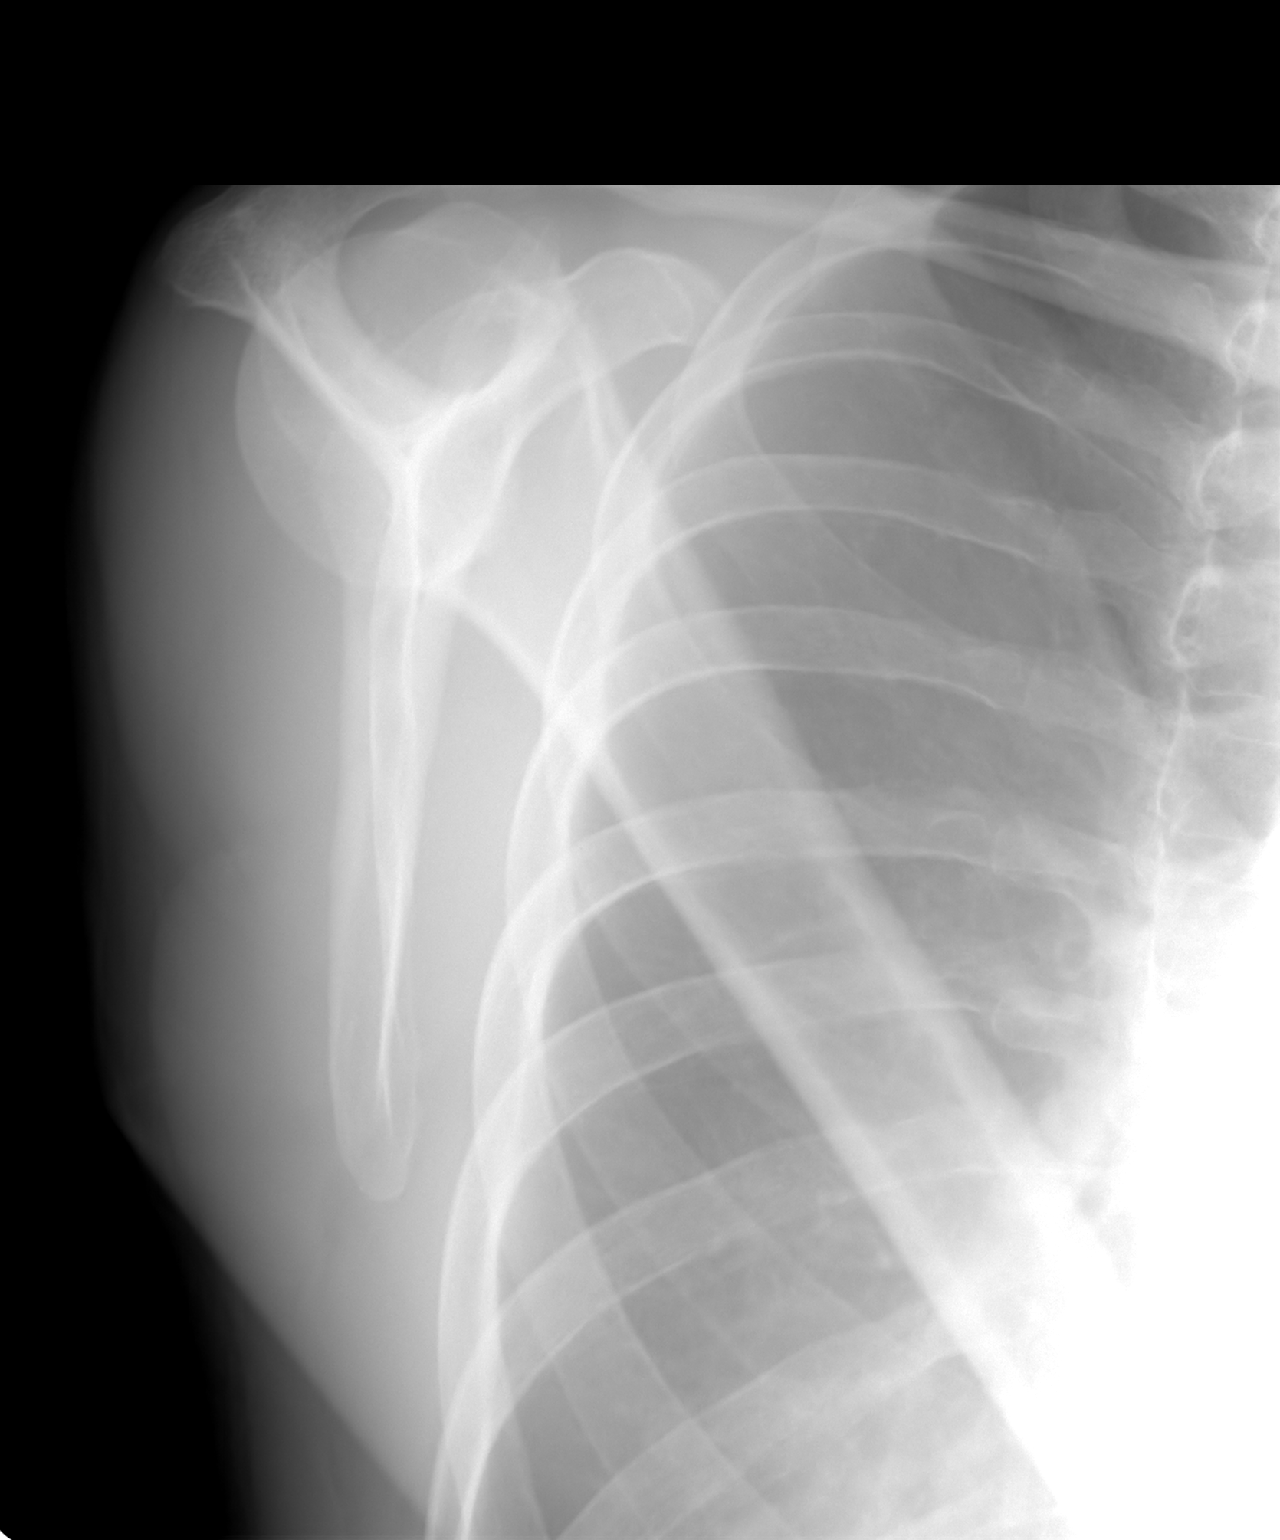

[3 of 3 positions shown; findings below may reference images not displayed]

FINDINGS: Normal alignment.  No fracture.  Mild AC joint
degenerative change.
IMPRESSION: No acute finding. AC joint degenerative change.

## 2012-11-26 ENCOUNTER — Other Ambulatory Visit: Payer: Self-pay | Admitting: Family Medicine

## 2012-12-31 ENCOUNTER — Other Ambulatory Visit: Payer: Self-pay | Admitting: Family Medicine

## 2013-01-26 ENCOUNTER — Other Ambulatory Visit: Payer: Self-pay | Admitting: Family Medicine

## 2013-03-30 ENCOUNTER — Other Ambulatory Visit: Payer: Self-pay | Admitting: Family Medicine

## 2013-05-02 ENCOUNTER — Other Ambulatory Visit: Payer: Self-pay | Admitting: Family Medicine

## 2013-10-30 ENCOUNTER — Other Ambulatory Visit: Payer: Self-pay | Admitting: Family Medicine

## 2013-12-02 ENCOUNTER — Other Ambulatory Visit: Payer: Self-pay | Admitting: Family Medicine

## 2013-12-07 ENCOUNTER — Other Ambulatory Visit: Payer: Self-pay | Admitting: Family Medicine

## 2013-12-08 ENCOUNTER — Other Ambulatory Visit: Payer: Self-pay

## 2013-12-08 ENCOUNTER — Other Ambulatory Visit: Payer: Self-pay | Admitting: Family Medicine

## 2013-12-08 MED ORDER — LEVOTHYROXINE SODIUM 112 MCG PO TABS: ORAL_TABLET | ORAL | Status: DC

## 2013-12-08 NOTE — Telephone Encounter (Signed)
i have not seen in 2 years.  #30, 2 refills  F/u CPX

## 2013-12-08 NOTE — Telephone Encounter (Signed)
CPX scheduled for 12/28/2013 @ 8:00am.

## 2013-12-08 NOTE — Telephone Encounter (Signed)
Pt left v/m; pt has been without levothyroxine for 3 days and request refill to [facility] [address]. Pt request cb. Pt last seen 01/17/2012.

## 2013-12-21 ENCOUNTER — Other Ambulatory Visit: Payer: Self-pay | Admitting: Family Medicine

## 2013-12-21 DIAGNOSIS — Z13220 Encounter for screening for lipoid disorders: Secondary | ICD-10-CM

## 2013-12-21 DIAGNOSIS — Z79899 Other long term (current) drug therapy: Secondary | ICD-10-CM

## 2013-12-21 DIAGNOSIS — E039 Hypothyroidism, unspecified: Secondary | ICD-10-CM

## 2013-12-21 DIAGNOSIS — Z125 Encounter for screening for malignant neoplasm of prostate: Secondary | ICD-10-CM

## 2013-12-24 ENCOUNTER — Other Ambulatory Visit (INDEPENDENT_AMBULATORY_CARE_PROVIDER_SITE_OTHER): Payer: BC Managed Care – PPO

## 2013-12-24 DIAGNOSIS — E039 Hypothyroidism, unspecified: Secondary | ICD-10-CM

## 2013-12-24 DIAGNOSIS — Z125 Encounter for screening for malignant neoplasm of prostate: Secondary | ICD-10-CM

## 2013-12-24 DIAGNOSIS — Z79899 Other long term (current) drug therapy: Secondary | ICD-10-CM

## 2013-12-24 DIAGNOSIS — Z13220 Encounter for screening for lipoid disorders: Secondary | ICD-10-CM

## 2013-12-24 LAB — BASIC METABOLIC PANEL
BUN: 19 mg/dL (ref 6–23)
CHLORIDE: 105 meq/L (ref 96–112)
CO2: 29 meq/L (ref 19–32)
Calcium: 9.4 mg/dL (ref 8.4–10.5)
Creatinine, Ser: 0.8 mg/dL (ref 0.4–1.5)
GFR: 106.59 mL/min (ref >60.00)
Glucose, Bld: 107 mg/dL — ABNORMAL HIGH (ref 70–99)
POTASSIUM: 4.7 meq/L (ref 3.5–5.1)
Sodium: 140 mEq/L (ref 135–145)

## 2013-12-24 LAB — LIPID PANEL
CHOLESTEROL: 194 mg/dL (ref 0–200)
HDL: 36.8 mg/dL — ABNORMAL LOW (ref >39.00)
LDL Cholesterol: 125 mg/dL — ABNORMAL HIGH (ref 0–99)
TRIGLYCERIDES: 163 mg/dL — AB (ref 0.0–149.0)
Total CHOL/HDL Ratio: 5
VLDL: 32.6 mg/dL (ref 0.0–40.0)

## 2013-12-24 LAB — CBC WITH DIFFERENTIAL/PLATELET
Basophils Absolute: 0.1 10*3/uL (ref 0.0–0.1)
Basophils Relative: 0.7 % (ref 0.0–3.0)
EOS PCT: 3.1 % (ref 0.0–5.0)
Eosinophils Absolute: 0.2 10*3/uL (ref 0.0–0.7)
HEMATOCRIT: 43.4 % (ref 39.0–52.0)
Hemoglobin: 14.4 g/dL (ref 13.0–17.0)
LYMPHS ABS: 2.8 10*3/uL (ref 0.7–4.0)
Lymphocytes Relative: 36.8 % (ref 12.0–46.0)
MCHC: 33.1 g/dL (ref 30.0–36.0)
MCV: 91.1 fl (ref 78.0–100.0)
MONO ABS: 0.7 10*3/uL (ref 0.1–1.0)
Monocytes Relative: 9.4 % (ref 3.0–12.0)
NEUTROS PCT: 50 % (ref 43.0–77.0)
Neutro Abs: 3.9 10*3/uL (ref 1.4–7.7)
Platelets: 302 10*3/uL (ref 150.0–400.0)
RBC: 4.76 Mil/uL (ref 4.22–5.81)
RDW: 13.4 % (ref 11.5–14.6)
WBC: 7.8 10*3/uL (ref 4.5–10.5)

## 2013-12-24 LAB — HEPATIC FUNCTION PANEL
ALT: 14 U/L (ref 0–53)
AST: 16 U/L (ref 0–37)
Albumin: 4.3 g/dL (ref 3.5–5.2)
Alkaline Phosphatase: 44 U/L (ref 39–117)
Bilirubin, Direct: 0 mg/dL (ref 0.0–0.3)
Total Bilirubin: 0.4 mg/dL (ref 0.3–1.2)
Total Protein: 7.3 g/dL (ref 6.0–8.3)

## 2013-12-24 LAB — TSH: TSH: 3.85 u[IU]/mL (ref 0.35–5.50)

## 2013-12-24 LAB — T4, FREE: FREE T4: 0.82 ng/dL (ref 0.60–1.60)

## 2013-12-24 LAB — T3, FREE: T3, Free: 2.8 pg/mL (ref 2.3–4.2)

## 2013-12-24 LAB — PSA: PSA: 0.69 ng/mL (ref 0.10–4.00)

## 2013-12-28 ENCOUNTER — Ambulatory Visit (INDEPENDENT_AMBULATORY_CARE_PROVIDER_SITE_OTHER): Payer: BC Managed Care – PPO | Admitting: Family Medicine

## 2013-12-28 ENCOUNTER — Encounter: Payer: Self-pay | Admitting: Family Medicine

## 2013-12-28 ENCOUNTER — Telehealth: Payer: Self-pay | Admitting: Family Medicine

## 2013-12-28 VITALS — BP 110/70 | HR 63 | Temp 97.6°F | Ht 69.5 in | Wt 185.0 lb

## 2013-12-28 DIAGNOSIS — Z0000 Encounter for general adult medical examination without abnormal findings: Secondary | ICD-10-CM

## 2013-12-28 MED ORDER — LEVOTHYROXINE SODIUM 112 MCG PO TABS: ORAL_TABLET | ORAL | Status: DC

## 2013-12-28 NOTE — Progress Notes (Signed)
[place] [address] Phone: [phone] Fax: [phone]  Patient ID: Colin Cobb MRN: [id number], DOB: 08-24-1966, 48 y.o. Date of Encounter: 12/28/2013  Primary Physician:  Owens Loffler, MD   Chief Complaint: Annual Exam   Subjective:   History of Present Illness:  Colin Cobb is a 48 y.o. pleasant patient who presents with the following:  Preventative Health Maintenance Visit:  Health Maintenance Summary Reviewed and updated, unless pt declines services.  Tobacco History Reviewed. Alcohol: No concerns, no excessive use Exercise Habits: Some activity, rec at least 30 mins 5 times a week STD concerns: no risk or activity to increase risk Drug Use: None Encouraged self-testicular check  Health Maintenance  Topic Date Due  . Influenza Vaccine  04/03/2014  . Tetanus/tdap  09/03/2016    Immunization History  Administered Date(s) Administered  . Td 09/03/2006    Patient Active Problem List   Diagnosis Date Noted  . WEIGHT LOSS 09/29/2009  . HYPOTHYROIDISM 12/27/2008  . TOBACCO USE 12/27/2008  . COMMON MIGRAINE 12/27/2008  . GERD 12/27/2008    Past Medical History  Diagnosis Date  . Migraines   . GERD (gastroesophageal reflux disease)   . Hypothyroidism   . Tobacco abuse   . Leg fracture, right     Past Surgical History  Procedure Laterality Date  . Appendectomy  1988    History   Social History  . Marital Status: Single    Spouse Name: N/A    Number of Children: N/A  . Years of Education: N/A   Occupational History  . Painter/ Carpenter    Social History Main Topics  . Smoking status: Current Every Day Smoker  . Smokeless tobacco: Never Used     Comment: 60 pack years  . Alcohol Use: No  . Drug Use: No     Comment: h/o cocaine and MJ, none now  . Sexual Activity: Not on file   Other Topics Concern  . Not on file   Social History Narrative  . No narrative on file    Family History  Problem Relation Age  of Onset  . Cancer Maternal Grandmother     Breast cancer    No Known Allergies  Medication list has been reviewed and updated.  Review of Systems:  General: Denies fever, chills, sweats. No significant weight loss. Eyes: Denies blurring,significant itching ENT: Denies earache, sore throat, and hoarseness. Cardiovascular: Denies chest pains, palpitations, dyspnea on exertion Respiratory: Denies cough, dyspnea at rest,wheeezing Breast: no concerns about lumps GI: Denies nausea, vomiting, diarrhea, constipation, change in bowel habits, abdominal pain, melena, hematochezia GU: Denies penile discharge, ED, urinary flow / outflow problems. No STD concerns. Musculoskeletal: Denies back pain, joint pain Derm: Denies rash, itching Neuro: Denies  paresthesias, frequent falls, frequent headaches Psych: Denies depression, anxiety SOME STRESS AT HOME, MOM IS ILL Endocrine: Denies cold intolerance, heat intolerance, polydipsia Heme: Denies enlarged lymph nodes Allergy: No hayfever  Objective:   Physical Examination: BP 110/70  Pulse 63  Temp(Src) 97.6 F (36.4 C) (Oral)  Ht 5' 9.5" (1.765 m)  Wt 185 lb (83.915 kg)  BMI 26.94 kg/m2 Ideal Body Weight: Weight in (lb) to have BMI = 25: 171.4  GEN: well developed, well nourished, no acute distress Eyes: conjunctiva and lids normal, PERRLA, EOMI ENT: TM clear, nares clear, oral exam WNL Neck: supple, no lymphadenopathy, no thyromegaly, no JVD Pulm: clear to auscultation and percussion, respiratory effort normal CV: regular rate and rhythm, S1-S2, no  murmur, rub or gallop, no bruits, peripheral pulses normal and symmetric, no cyanosis, clubbing, edema or varicosities GI: soft, non-tender; no hepatosplenomegaly, masses; active bowel sounds all quadrants GU: no hernia, testicular mass, penile discharge Lymph: no cervical, axillary or inguinal adenopathy MSK: gait normal, muscle tone and strength WNL, no joint swelling, effusions,  discoloration, crepitus  SKIN: clear, good turgor, color WNL, no rashes, lesions, or ulcerations Neuro: normal mental status, normal strength, sensation, and motion Psych: alert; oriented to person, place and time, normally interactive and not anxious or depressed in appearance.  All labs reviewed with patient.  Lipids:    Component Value Date/Time   CHOL 194 12/24/2013 0909   TRIG 163.0* 12/24/2013 0909   HDL 36.80* 12/24/2013 0909   LDLDIRECT 130* 08/25/2010 2030   VLDL 32.6 12/24/2013 0909   CHOLHDL 5 12/24/2013 0909    CBC:    Component Value Date/Time   WBC 7.8 12/24/2013 0909   HGB 14.4 12/24/2013 0909   HCT 43.4 12/24/2013 0909   PLT 302.0 12/24/2013 0909   MCV 91.1 12/24/2013 0909   NEUTROABS 3.9 12/24/2013 0909   LYMPHSABS 2.8 12/24/2013 0909   MONOABS 0.7 12/24/2013 0909   EOSABS 0.2 12/24/2013 0909   BASOSABS 0.1 12/24/2013 5027    Basic Metabolic Panel:    Component Value Date/Time   NA 140 12/24/2013 0909   K 4.7 12/24/2013 0909   CL 105 12/24/2013 0909   CO2 29 12/24/2013 0909   BUN 19 12/24/2013 0909   CREATININE 0.8 12/24/2013 0909   GLUCOSE 107* 12/24/2013 0909   CALCIUM 9.4 12/24/2013 0909    Lab Results  Component Value Date   ALT 14 12/24/2013   AST 16 12/24/2013   ALKPHOS 44 12/24/2013   BILITOT 0.4 12/24/2013    Lab Results  Component Value Date   TSH 3.85 12/24/2013    Lab Results  Component Value Date   PSA 0.69 12/24/2013   PSA 0.59 10/24/2011   PSA 0.57 08/25/2010    Assessment & Plan:   Routine general medical examination at a health care facility  Health Maintenance Exam: The patient's preventative maintenance and recommended screening tests for an annual wellness exam were reviewed in full today. Brought up to date unless services declined.  Counselled on the importance of diet, exercise, and its role in overall health and mortality. The patient's FH and SH was reviewed, including their home life, tobacco status, and drug and alcohol  status.  Follow-up: No Follow-up on file. Or follow-up in 1 year for complete physical examination  Signed,  Frederico Hamman T. Felesha Moncrieffe, MD, [place]  Patient's Medications  New Prescriptions   No medications on file  Previous Medications   No medications on file  Modified Medications   Modified Medication Previous Medication   LEVOTHYROXINE (SYNTHROID, LEVOTHROID) 112 MCG TABLET levothyroxine (SYNTHROID, LEVOTHROID) 112 MCG tablet      TAKE 1 TABLET (112 MCG TOTAL) BY MOUTH DAILY.    TAKE 1 TABLET (112 MCG TOTAL) BY MOUTH DAILY.  Discontinued Medications   No medications on file

## 2013-12-28 NOTE — Telephone Encounter (Signed)
Relevant patient education mailed to patient.  

## 2013-12-28 NOTE — Progress Notes (Signed)
Pre visit review using our clinic review tool, if applicable. No additional management support is needed unless otherwise documented below in the visit note. 

## 2014-03-22 ENCOUNTER — Ambulatory Visit (INDEPENDENT_AMBULATORY_CARE_PROVIDER_SITE_OTHER): Payer: BC Managed Care – PPO | Admitting: Family Medicine

## 2014-03-22 ENCOUNTER — Encounter: Payer: Self-pay | Admitting: Family Medicine

## 2014-03-22 ENCOUNTER — Telehealth: Payer: Self-pay | Admitting: Family Medicine

## 2014-03-22 VITALS — BP 130/80 | HR 62 | Temp 98.1°F | Ht 69.5 in | Wt 182.0 lb

## 2014-03-22 DIAGNOSIS — S43499A Other sprain of unspecified shoulder joint, initial encounter: Secondary | ICD-10-CM

## 2014-03-22 DIAGNOSIS — M5010 Cervical disc disorder with radiculopathy, unspecified cervical region: Secondary | ICD-10-CM

## 2014-03-22 DIAGNOSIS — M5412 Radiculopathy, cervical region: Secondary | ICD-10-CM

## 2014-03-22 DIAGNOSIS — S46811A Strain of other muscles, fascia and tendons at shoulder and upper arm level, right arm, initial encounter: Secondary | ICD-10-CM

## 2014-03-22 DIAGNOSIS — S46819A Strain of other muscles, fascia and tendons at shoulder and upper arm level, unspecified arm, initial encounter: Secondary | ICD-10-CM

## 2014-03-22 MED ORDER — CYCLOBENZAPRINE HCL 10 MG PO TABS: 10.0000 mg | ORAL_TABLET | Freq: Three times a day (TID) | ORAL | Status: DC | PRN

## 2014-03-22 MED ORDER — PREDNISONE 20 MG PO TABS: ORAL_TABLET | ORAL | Status: DC

## 2014-03-22 NOTE — Telephone Encounter (Signed)
Relevant patient education mailed to patient.  

## 2014-03-22 NOTE — Progress Notes (Signed)
[place] [address] Phone: [phone] Fax: [phone]  Patient ID: Colin RICKETT MRN: [id number], DOB: [birth year], 48 y.o. Date of Encounter: 03/22/2014  Primary Physician:  Owens Loffler, MD   Chief Complaint: Shoulder Pain   Subjective:   History of Present Illness:  Colin Cobb is a 48 y.o. very pleasant male patient who presents with the following:  R shoulder, for 2 weeks, only sleeping about 2 hours of sleep a night. Step dad retired and moved some heavy tools and pain all the way in his shoulder blade. Has been using a lot of biofreeze. He does not really have a significant history of shoulder pain or neck pain. He had pain probably primarily in the parascapular region, but he is also had some radicular pain in tingling down through his entirety of his right arm. Is not really having much pain with abduction or internal range of motion.  Took some ibuprofen and doan's.    Past Medical History, Surgical History, Social History, Family History, Problem List, Medications, and Allergies have been reviewed and updated if relevant.  Review of Systems:  GEN: No fevers, chills. Nontoxic. Primarily MSK c/o today. MSK: Detailed in the HPI GI: tolerating PO intake without difficulty Neuro: No numbness, parasthesias, or tingling associated. Otherwise the pertinent positives of the ROS are noted above.   Objective:   Physical Examination: BP 130/80  Pulse 62  Temp(Src) 98.1 F (36.7 C) (Oral)  Ht 5' 9.5" (1.765 m)  Wt 182 lb (82.555 kg)  BMI 26.50 kg/m2   GEN: Well-developed,well-nourished,in no acute distress; alert,appropriate and cooperative throughout examination HEENT: Normocephalic and atraumatic without obvious abnormalities. Ears, externally no deformities PULM: Breathing comfortably in no respiratory distress EXT: No clubbing, cyanosis, or edema PSYCH: Normally interactive. Cooperative during the interview. Pleasant. Friendly and  conversant. Not anxious or depressed appearing. Normal, full affect.  CERVICAL SPINE EXAM Range of motion: Flexion, extension, lateral bending, and rotation: full Pain with terminal motion: mild Spinous Processes: NT SCM: NT Upper paracervical muscles: mild ttp Upper traps: ttp more on the r C5-T1 intact, sensation and motor   Shoulder: R Inspection: No muscle wasting or winging Ecchymosis/edema: neg  AC joint, scapula, clavicle: NT Cervical spine: NT, full ROM Spurling's: neg Abduction: full, 5/5 Flexion: full, 5/5 IR, full, lift-off: 5/5 ER at neutral: full, 5/5 AC crossover and compression: neg Neer: neg Hawkins: minimal pos Drop Test: neg Empty Can: neg Supraspinatus insertion: NT Bicipital groove: NT Speed's: neg Yergason's: neg Sulcus sign: neg Scapular dyskinesis: none C5-T1 intact Sensation intact Grip 5/5   Radiology: No results found.  Assessment & Plan:   Cervical disc disorder with radiculopathy of cervical region  Trapezius strain, right, initial encounter  His shoulder exam is fairly benign. I think this is all coming from his neck. 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  Continue with stretching and massage, 14 days of prednisone, and Flexeril at night the  New Prescriptions   CYCLOBENZAPRINE (FLEXERIL) 10 MG TABLET    Take 1 tablet (10 mg total) by mouth 3 (three) times daily as needed for muscle spasms.   PREDNISONE (DELTASONE) 20 MG TABLET    2 tabs po daily for 1 week, then 1 tab po daily for 1 week   Modified Medications   No medications on file   No orders of the defined types were placed in this encounter.   Follow-up:  No Follow-up on file. Unless noted above, the patient is to follow-up if symptoms worsen. Red flags were reviewed with the patient.  Signed,  [name]. Arthurine Oleary, MD, [facility]   Discontinued Medications   No medications on file   Current  Medications at Discharge:   Medication List       This list is accurate as of: 03/22/14  2:11 PM.  Always use your most recent med list.               cyclobenzaprine 10 MG tablet  Commonly known as:  FLEXERIL  Take 1 tablet (10 mg total) by mouth 3 (three) times daily as needed for muscle spasms.     levothyroxine 112 MCG tablet  Commonly known as:  SYNTHROID, LEVOTHROID  TAKE 1 TABLET (112 MCG TOTAL) BY MOUTH DAILY.     predniSONE 20 MG tablet  Commonly known as:  DELTASONE  2 tabs po daily for 1 week, then 1 tab po daily for 1 week

## 2014-03-22 NOTE — Progress Notes (Signed)
Pre visit review using our clinic review tool, if applicable. No additional management support is needed unless otherwise documented below in the visit note. 

## 2014-03-23 ENCOUNTER — Telehealth: Payer: Self-pay

## 2014-03-23 MED ORDER — TRAMADOL HCL 50 MG PO TABS: 50.0000 mg | ORAL_TABLET | Freq: Four times a day (QID) | ORAL | Status: DC | PRN

## 2014-03-23 NOTE — Telephone Encounter (Signed)
Pt was seen on 03/22/14; right shoulder pain is worse; pt is not able to sleep at night; muscle relaxer is not helping pain and pt request pain med to [place].Please advise.

## 2014-03-23 NOTE — Telephone Encounter (Signed)
He needs to give more time - nerve / neck pain never resolves in a few days.  Tramadol 50 mg, 1 po q 6 hours prn pain, #50, 2 refills   The DEA has limited most pain meds that we can call in. If this does not help, then i can write him a hard script.

## 2014-03-23 NOTE — Telephone Encounter (Signed)
Called to [place].  Mr. Dragone notified as instructed by telephone.

## 2014-04-13 ENCOUNTER — Telehealth: Payer: Self-pay

## 2014-04-13 MED ORDER — HYDROCODONE-ACETAMINOPHEN 5-325 MG PO TABS: 1.0000 | ORAL_TABLET | Freq: Four times a day (QID) | ORAL | Status: DC | PRN

## 2014-04-13 NOTE — Telephone Encounter (Signed)
Not surprising, he is having cervical radiculopathy. Somehow that may not have been clear, but i am not not surprised - it is to be expected to have radicular symptoms.  If my partner will agree Norco 5/325, 1-2 tablets po q 6 hours prn severe pain, #40, 0 ref.  F/u with me next week.

## 2014-04-13 NOTE — Telephone Encounter (Signed)
Colin Cobb notified as instructed by telephone.  He will stop by the office in the morning to pick up his prescription and at the same time make an appointment to follow up with Dr. Lorelei Pont next week.

## 2014-04-13 NOTE — Telephone Encounter (Signed)
Colin Cobb left v/m;Pt was seen 03/22/14 with pulled muscle in back and neck pain;now has pain in rt arm. Heat seems to help pain.Tramadol is not effective and request stronger pain med. Pt is not able to sleep at night due to pain. Pain level is 10. [facility] [address] (see 03/23/14 phone note re; to pain med).

## 2014-04-19 ENCOUNTER — Encounter: Payer: Self-pay | Admitting: Family Medicine

## 2014-04-19 ENCOUNTER — Ambulatory Visit (INDEPENDENT_AMBULATORY_CARE_PROVIDER_SITE_OTHER): Payer: BC Managed Care – PPO | Admitting: Family Medicine

## 2014-04-19 VITALS — BP 108/68 | HR 68 | Temp 98.2°F | Ht 69.5 in | Wt 180.8 lb

## 2014-04-19 DIAGNOSIS — M5010 Cervical disc disorder with radiculopathy, unspecified cervical region: Secondary | ICD-10-CM

## 2014-04-19 DIAGNOSIS — M5412 Radiculopathy, cervical region: Secondary | ICD-10-CM

## 2014-04-19 NOTE — Progress Notes (Signed)
Pre visit review using our clinic review tool, if applicable. No additional management support is needed unless otherwise documented below in the visit note. 

## 2014-04-19 NOTE — Progress Notes (Signed)
[place] [address] Phone: [phone] Fax: [phone]  Patient ID: Colin Cobb MRN: [id number], DOB: 03-Jan-1966, 48 y.o. Date of Encounter: 04/19/2014  Primary Physician:  Owens Loffler, MD   Chief Complaint: Follow-up   Subjective:   History of Present Illness:  Colin Cobb is a 48 y.o. very pleasant male patient who presents with the following:  rm is doing. Doing a lot better in the last week or so. No radiculopathy. S/p pred burst and taper. I had to get him some vicodin last week due to pain. Feels fine, stopped all meds.   03/22/2014 Last OV with Owens Loffler, MD  R shoulder, for 2 weeks, only sleeping about 2 hours of sleep a night. Step dad retired and moved some heavy tools and pain all the way in his shoulder blade. Has been using a lot of biofreeze. He does not really have a significant history of shoulder pain or neck pain. He had pain probably primarily in the parascapular region, but he is also had some radicular pain in tingling down through his entirety of his right arm. Is not really having much pain with abduction or internal range of motion.  Took some ibuprofen and doan's.    Past Medical History, Surgical History, Social History, Family History, Problem List, Medications, and Allergies have been reviewed and updated if relevant.  Review of Systems:  GEN: No fevers, chills. Nontoxic. Primarily MSK c/o today. MSK: Detailed in the HPI GI: tolerating PO intake without difficulty Neuro: No numbness, parasthesias, or tingling associated. Otherwise the pertinent positives of the ROS are noted above.   Objective:   Physical Examination: BP 108/68  Pulse 68  Temp(Src) 98.2 F (36.8 C) (Oral)  Ht 5' 9.5" (1.765 m)  Wt 180 lb 12 oz (81.988 kg)  BMI 26.32 kg/m2   GEN: Well-developed,well-nourished,in no acute distress; alert,appropriate and cooperative throughout examination HEENT: Normocephalic and atraumatic without  obvious abnormalities. Ears, externally no deformities PULM: Breathing comfortably in no respiratory distress EXT: No clubbing, cyanosis, or edema PSYCH: Normally interactive. Cooperative during the interview. Pleasant. Friendly and conversant. Not anxious or depressed appearing. Normal, full affect.  CERVICAL SPINE EXAM Range of motion: Flexion, extension, lateral bending, and rotation: full Pain with terminal motion: none Spinous Processes: NT SCM: NT Upper paracervical muscles: nt Upper traps: nt C5-T1 intact, sensation and motor  neurovasc intact c5-t1  Radiology: No results found.  Assessment & Plan:   Cervical disc disorder with radiculopathy of cervical region - Plan: CANCELED: DG Cervical Spine Complete  Doing much better, f/u prn  03/22/2014 Last OV with Owens Loffler, MD  His shoulder exam is fairly benign. I think this is all coming from his neck. 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  Continue with stretching and massage, 14 days of prednisone, and Flexeril at night the  New Prescriptions   No medications on file   Follow-up: No Follow-up on file. Unless noted above, the patient is to follow-up if symptoms worsen. Red flags were reviewed with the patient.  Signed,  [name]. Ceferino Lang, MD, [facility]   Discontinued Medications   PREDNISONE (DELTASONE) 20 MG TABLET    2 tabs po daily for 1 week, then 1 tab po daily for 1 week   Patient's Medications  New Prescriptions   No medications on file  Previous Medications   CYCLOBENZAPRINE (FLEXERIL) 10 MG TABLET  Take 1 tablet (10 mg total) by mouth 3 (three) times daily as needed for muscle spasms.   HYDROCODONE-ACETAMINOPHEN (NORCO) 5-325 MG PER TABLET    Take 1-2 tablets by mouth every 6 (six) hours as needed for moderate pain.   LEVOTHYROXINE (SYNTHROID, LEVOTHROID) 112 MCG TABLET    TAKE 1 TABLET (112 MCG TOTAL) BY MOUTH DAILY.    TRAMADOL (ULTRAM) 50 MG TABLET    Take 1 tablet (50 mg total) by mouth every 6 (six) hours as needed.  Modified Medications   No medications on file  Discontinued Medications   PREDNISONE (DELTASONE) 20 MG TABLET    2 tabs po daily for 1 week, then 1 tab po daily for 1 week

## 2015-01-05 ENCOUNTER — Other Ambulatory Visit: Payer: Self-pay | Admitting: Family Medicine

## 2015-01-05 NOTE — Telephone Encounter (Signed)
Please call and schedule CPE with fasting labs with Dr. Lorelei Pont for sometime in the next three months.

## 2015-04-08 ENCOUNTER — Other Ambulatory Visit: Payer: Self-pay | Admitting: Family Medicine

## 2015-04-08 NOTE — Telephone Encounter (Signed)
Please call and schedule CPE with fasting labs prior.  Needs office visit before getting any additional refills.

## 2015-04-11 ENCOUNTER — Other Ambulatory Visit: Payer: Self-pay | Admitting: *Deleted

## 2015-04-11 MED ORDER — LEVOTHYROXINE SODIUM 112 MCG PO TABS: ORAL_TABLET | ORAL | Status: DC

## 2015-07-06 ENCOUNTER — Other Ambulatory Visit: Payer: Self-pay | Admitting: Family Medicine

## 2015-07-13 ENCOUNTER — Ambulatory Visit (INDEPENDENT_AMBULATORY_CARE_PROVIDER_SITE_OTHER): Payer: No Typology Code available for payment source | Admitting: Family Medicine

## 2015-07-13 ENCOUNTER — Encounter: Payer: Self-pay | Admitting: Family Medicine

## 2015-07-13 VITALS — BP 120/80 | HR 63 | Temp 97.6°F | Ht 69.0 in | Wt 182.5 lb

## 2015-07-13 DIAGNOSIS — Z0000 Encounter for general adult medical examination without abnormal findings: Secondary | ICD-10-CM

## 2015-07-13 DIAGNOSIS — E038 Other specified hypothyroidism: Secondary | ICD-10-CM | POA: Diagnosis not present

## 2015-07-13 DIAGNOSIS — Z13220 Encounter for screening for lipoid disorders: Secondary | ICD-10-CM | POA: Diagnosis not present

## 2015-07-13 DIAGNOSIS — Z79899 Other long term (current) drug therapy: Secondary | ICD-10-CM

## 2015-07-13 DIAGNOSIS — Z125 Encounter for screening for malignant neoplasm of prostate: Secondary | ICD-10-CM

## 2015-07-13 LAB — BASIC METABOLIC PANEL
BUN: 16 mg/dL (ref 6–23)
CALCIUM: 9.9 mg/dL (ref 8.4–10.5)
CO2: 27 meq/L (ref 19–32)
CREATININE: 0.79 mg/dL (ref 0.40–1.50)
Chloride: 105 mEq/L (ref 96–112)
GFR: 110.56 mL/min (ref >60.00)
Glucose, Bld: 101 mg/dL — ABNORMAL HIGH (ref 70–99)
Potassium: 4.6 mEq/L (ref 3.5–5.1)
SODIUM: 141 meq/L (ref 135–145)

## 2015-07-13 LAB — CBC WITH DIFFERENTIAL/PLATELET
BASOS ABS: 0 10*3/uL (ref 0.0–0.1)
Basophils Relative: 0.6 % (ref 0.0–3.0)
Eosinophils Absolute: 0.3 10*3/uL (ref 0.0–0.7)
Eosinophils Relative: 3.7 % (ref 0.0–5.0)
HCT: 44.8 % (ref 39.0–52.0)
Hemoglobin: 14.5 g/dL (ref 13.0–17.0)
LYMPHS ABS: 2.6 10*3/uL (ref 0.7–4.0)
Lymphocytes Relative: 34.9 % (ref 12.0–46.0)
MCHC: 32.3 g/dL (ref 30.0–36.0)
MCV: 91 fl (ref 78.0–100.0)
MONO ABS: 0.9 10*3/uL (ref 0.1–1.0)
MONOS PCT: 12.3 % — AB (ref 3.0–12.0)
NEUTROS ABS: 3.6 10*3/uL (ref 1.4–7.7)
NEUTROS PCT: 48.5 % (ref 43.0–77.0)
PLATELETS: 264 10*3/uL (ref 150.0–400.0)
RBC: 4.92 Mil/uL (ref 4.22–5.81)
RDW: 14 % (ref 11.5–15.5)
WBC: 7.4 10*3/uL (ref 4.0–10.5)

## 2015-07-13 LAB — T4, FREE: Free T4: 0.74 ng/dL (ref 0.60–1.60)

## 2015-07-13 LAB — LIPID PANEL
CHOL/HDL RATIO: 4
Cholesterol: 194 mg/dL (ref 0–200)
HDL: 44.9 mg/dL (ref >39.00)
LDL CALC: 116 mg/dL — AB (ref 0–99)
NONHDL: 149.43
TRIGLYCERIDES: 166 mg/dL — AB (ref 0.0–149.0)
VLDL: 33.2 mg/dL (ref 0.0–40.0)

## 2015-07-13 LAB — PSA: PSA: 0.58 ng/mL (ref 0.10–4.00)

## 2015-07-13 LAB — HEPATIC FUNCTION PANEL
ALK PHOS: 49 U/L (ref 39–117)
ALT: 11 U/L (ref 0–53)
AST: 14 U/L (ref 0–37)
Albumin: 4.7 g/dL (ref 3.5–5.2)
BILIRUBIN DIRECT: 0 mg/dL (ref 0.0–0.3)
BILIRUBIN TOTAL: 0.3 mg/dL (ref 0.2–1.2)
Total Protein: 7.2 g/dL (ref 6.0–8.3)

## 2015-07-13 LAB — T3, FREE: T3, Free: 3.3 pg/mL (ref 2.3–4.2)

## 2015-07-13 LAB — TSH: TSH: 2.02 u[IU]/mL (ref 0.35–4.50)

## 2015-07-13 NOTE — Addendum Note (Signed)
Addended by: Daralene Milch C on: 07/13/2015 03:55 PM   Modules accepted: Miquel Dunn

## 2015-07-13 NOTE — Progress Notes (Signed)
Dr. Frederico Hamman T. Melani Brisbane, MD, [facility] [place], [address] Phone: [phone] Fax: [phone]  07/13/2015  Patient: Colin Cobb, MRN: [id number], DOB: [birth year], 49 y.o.  Primary Physician:  Owens Loffler, MD   Chief Complaint  Patient presents with  . Annual Exam   Subjective:   Colin Cobb is a 49 y.o. pleasant patient who presents with the following:  Preventative Health Maintenance Visit:  Feeling fine.   Health Maintenance Summary Reviewed and updated, unless pt declines services.  Tobacco History Reviewed. + 1/3 PPD Alcohol: No concerns, no excessive use Exercise Habits: Some activity, rec at least 30 mins 5 times a week STD concerns: no risk or activity to increase risk Drug Use: None Encouraged self-testicular check  Health Maintenance  Topic Date Due  . INFLUENZA VACCINE  12/02/2015 (Originally 04/04/2015)  . TETANUS/TDAP  09/03/2016  . HIV Screening  Completed   Immunization History  Administered Date(s) Administered  . Td 09/03/2006   Patient Active Problem List   Diagnosis Date Noted  . WEIGHT LOSS 09/29/2009  . Hypothyroidism 12/27/2008  . TOBACCO USE 12/27/2008  . COMMON MIGRAINE 12/27/2008  . GERD 12/27/2008   Past Medical History  Diagnosis Date  . Migraines   . GERD (gastroesophageal reflux disease)   . Hypothyroidism   . Tobacco abuse   . Leg fracture, right    Past Surgical History  Procedure Laterality Date  . Appendectomy  1988   Social History   Social History  . Marital Status: Single    Spouse Name: N/A  . Number of Children: N/A  . Years of Education: N/A   Occupational History  . Painter/ [employer]    Social History Main Topics  . Smoking status: Current Every Day Smoker -- 0.50 packs/day for 28 years    Types: Cigarettes  . Smokeless tobacco: Never Used     Comment: 60 pack years  . Alcohol Use: No  . Drug Use: No     Comment: h/o cocaine  and MJ, none now  . Sexual Activity: Not on file   Other Topics Concern  . Not on file   Social History Narrative   Family History  Problem Relation Age of Onset  . Cancer Maternal Grandmother     Breast cancer   No Known Allergies  Medication list has been reviewed and updated.   General: Denies fever, chills, sweats. No significant weight loss. Eyes: Denies blurring,significant itching ENT: Denies earache, sore throat, and hoarseness. Cardiovascular: Denies chest pains, palpitations, dyspnea on exertion Respiratory: Denies cough, dyspnea at rest,wheeezing Breast: no concerns about lumps GI: Denies nausea, vomiting, diarrhea, constipation, change in bowel habits, abdominal pain, melena, hematochezia GU: Denies penile discharge, ED, urinary flow / outflow problems. No STD concerns. Musculoskeletal: Denies back pain, joint pain Derm: Denies rash, itching Neuro: Denies  paresthesias, frequent falls, frequent headaches Psych: Denies depression, anxiety Endocrine: Denies cold intolerance, heat intolerance, polydipsia Heme: Denies enlarged lymph nodes Allergy: No hayfever  Objective:   BP 120/80 mmHg  Pulse 63  Temp(Src) 97.6 F (36.4 C) (Oral)  Ht _0  (1.753 m)  Wt 182 lb 8 oz (82.781 kg)  BMI 26.94 kg/m2 Ideal Body Weight: Weight in (lb) to have BMI = 25: 168.9  No exam data present  GEN: well developed, well nourished, no acute distress Eyes: conjunctiva and lids normal, PERRLA, EOMI ENT: TM clear, nares clear, oral exam WNL Neck: supple, no lymphadenopathy,  no thyromegaly, no JVD Pulm: clear to auscultation and percussion, respiratory effort normal CV: regular rate and rhythm, S1-S2, no murmur, rub or gallop, no bruits, peripheral pulses normal and symmetric, no cyanosis, clubbing, edema or varicosities GI: soft, non-tender; no hepatosplenomegaly, masses; active bowel sounds all quadrants GU: no hernia, testicular mass, penile discharge Lymph: no cervical,  axillary or inguinal adenopathy MSK: gait normal, muscle tone and strength WNL, no joint swelling, effusions, discoloration, crepitus  SKIN: clear, good turgor, color WNL, no rashes, lesions, or ulcerations Neuro: normal mental status, normal strength, sensation, and motion Psych: alert; oriented to person, place and time, normally interactive and not anxious or depressed in appearance. All labs reviewed with patient.  Lipids:    Component Value Date/Time   CHOL 194 12/24/2013 0909   TRIG 163.0* 12/24/2013 0909   HDL 36.80* 12/24/2013 0909   LDLDIRECT 130* 08/25/2010 2030   VLDL 32.6 12/24/2013 0909   CHOLHDL 5 12/24/2013 0909   CBC: CBC Latest Ref Rng 12/24/2013 06/07/2011 08/25/2010  WBC 4.5 - 10.5 K/uL 7.8 7.6 7.7  Hemoglobin 13.0 - 17.0 g/dL 14.4 14.0 14.4  Hematocrit 39.0 - 52.0 % 43.4 42.4 43.3  Platelets 150.0 - 400.0 K/uL 302.0 226.0 155    Basic Metabolic Panel:    Component Value Date/Time   NA 140 12/24/2013 0909   K 4.7 12/24/2013 0909   CL 105 12/24/2013 0909   CO2 29 12/24/2013 0909   BUN 19 12/24/2013 0909   CREATININE 0.8 12/24/2013 0909   GLUCOSE 107* 12/24/2013 0909   CALCIUM 9.4 12/24/2013 0909   Hepatic Function Latest Ref Rng 12/24/2013 06/07/2011 08/25/2010  Total Protein 6.0 - 8.3 g/dL 7.3 7.8 7.8  Albumin 3.5 - 5.2 g/dL 4.3 4.8 5.1  AST 0 - 37 U/L _0 ALT 0 - 53 U/L _1 Alk Phosphatase 39 - 117 U/L 44 44 46  Total Bilirubin 0.3 - 1.2 mg/dL 0.4 0.3 0.4  Bilirubin, Direct 0.0 - 0.3 mg/dL 0.0 - -    Lab Results  Component Value Date   TSH 3.85 12/24/2013   Lab Results  Component Value Date   PSA 0.69 12/24/2013   PSA 0.59 10/24/2011   PSA 0.57 08/25/2010    Assessment and Plan:   Healthcare maintenance  Prostate cancer screening - Plan: PSA  Lipid screening - Plan: Lipid panel  Encounter for long-term (current) use of medications - Plan: CBC with Differential/Platelet, Basic metabolic panel, Hepatic function panel  Other  specified hypothyroidism - Plan: TSH, T3, free, T4, free  Health Maintenance Exam: The patient's preventative maintenance and recommended screening tests for an annual wellness exam were reviewed in full today. Brought up to date unless services declined.  Counselled on the importance of diet, exercise, and its role in overall health and mortality. The patient's FH and SH was reviewed, including their home life, tobacco status, and drug and alcohol status.  Follow-up: No Follow-up on file. Unless noted, follow-up in 1 year for Health Maintenance Exam.  New Prescriptions   No medications on file   Modified Medications   No medications on file   Orders Placed This Encounter  Procedures  . CBC with Differential/Platelet  . Basic metabolic panel  . Hepatic function panel  . Lipid panel  . PSA  . TSH  . T3, free  . T4, free    Signed,  Rosann Gorum T. Danisa Kopec, MD   Patient's Medications  New Prescriptions   No medications on file  Previous Medications   LEVOTHYROXINE (SYNTHROID, LEVOTHROID) 112 MCG TABLET    TAKE 1 TABLET (112 MCG TOTAL) BY MOUTH DAILY. (MUST KEEP PHYSICAL APPT FOR FURTHER REFILLS)  Modified Medications   No medications on file  Discontinued Medications   CYCLOBENZAPRINE (FLEXERIL) 10 MG TABLET    Take 1 tablet (10 mg total) by mouth 3 (three) times daily as needed for muscle spasms.   HYDROCODONE-ACETAMINOPHEN (NORCO) 5-325 MG PER TABLET    Take 1-2 tablets by mouth every 6 (six) hours as needed for moderate pain.   TRAMADOL (ULTRAM) 50 MG TABLET    Take 1 tablet (50 mg total) by mouth every 6 (six) hours as needed.

## 2015-07-13 NOTE — Progress Notes (Signed)
Pre visit review using our clinic review tool, if applicable. No additional management support is needed unless otherwise documented below in the visit note. 

## 2015-07-13 NOTE — Addendum Note (Signed)
Addended by: Daralene Milch C on: 07/13/2015 11:43 AM   Modules accepted: SmartSet

## 2015-07-14 ENCOUNTER — Encounter: Payer: Self-pay | Admitting: *Deleted

## 2015-08-06 ENCOUNTER — Other Ambulatory Visit: Payer: Self-pay | Admitting: Family Medicine

## 2015-10-11 ENCOUNTER — Other Ambulatory Visit: Payer: Self-pay | Admitting: *Deleted

## 2015-10-11 MED ORDER — LEVOTHYROXINE SODIUM 112 MCG PO TABS: ORAL_TABLET | ORAL | Status: DC

## 2016-07-11 ENCOUNTER — Other Ambulatory Visit: Payer: Self-pay | Admitting: Family Medicine

## 2016-07-16 ENCOUNTER — Ambulatory Visit: Payer: No Typology Code available for payment source | Admitting: Family Medicine

## 2016-08-07 ENCOUNTER — Other Ambulatory Visit: Payer: Self-pay | Admitting: Family Medicine

## 2016-08-23 ENCOUNTER — Other Ambulatory Visit: Payer: Self-pay | Admitting: Family Medicine

## 2016-08-23 NOTE — Telephone Encounter (Signed)
Ok to refill 90 day supply, 0 ref  But f/u with me for a CPX

## 2016-11-18 ENCOUNTER — Other Ambulatory Visit: Payer: Self-pay | Admitting: Family Medicine

## 2016-11-26 ENCOUNTER — Other Ambulatory Visit: Payer: Self-pay | Admitting: Family Medicine

## 2016-11-26 NOTE — Telephone Encounter (Signed)
Pt calling to ck on status of levothyroxine to [facility] [address]. Advised pt already done. Pt will ck with pharmacy.

## 2016-12-05 ENCOUNTER — Ambulatory Visit (INDEPENDENT_AMBULATORY_CARE_PROVIDER_SITE_OTHER): Payer: BLUE CROSS/BLUE SHIELD | Admitting: Family Medicine

## 2016-12-05 ENCOUNTER — Encounter: Payer: Self-pay | Admitting: Family Medicine

## 2016-12-05 VITALS — BP 120/86 | HR 60 | Temp 97.8°F | Ht 69.0 in | Wt 193.5 lb

## 2016-12-05 DIAGNOSIS — Z0000 Encounter for general adult medical examination without abnormal findings: Secondary | ICD-10-CM | POA: Diagnosis not present

## 2016-12-05 DIAGNOSIS — R5383 Other fatigue: Secondary | ICD-10-CM

## 2016-12-05 DIAGNOSIS — Z125 Encounter for screening for malignant neoplasm of prostate: Secondary | ICD-10-CM

## 2016-12-05 DIAGNOSIS — Z13220 Encounter for screening for lipoid disorders: Secondary | ICD-10-CM

## 2016-12-05 DIAGNOSIS — E038 Other specified hypothyroidism: Secondary | ICD-10-CM

## 2016-12-05 DIAGNOSIS — Z1211 Encounter for screening for malignant neoplasm of colon: Secondary | ICD-10-CM

## 2016-12-05 LAB — HEPATIC FUNCTION PANEL
ALT: 17 U/L (ref 0–53)
AST: 13 U/L (ref 0–37)
Albumin: 4.7 g/dL (ref 3.5–5.2)
Alkaline Phosphatase: 47 U/L (ref 39–117)
BILIRUBIN DIRECT: 0 mg/dL (ref 0.0–0.3)
BILIRUBIN TOTAL: 0.3 mg/dL (ref 0.2–1.2)
TOTAL PROTEIN: 7.3 g/dL (ref 6.0–8.3)

## 2016-12-05 LAB — CBC WITH DIFFERENTIAL/PLATELET
BASOS ABS: 0 10*3/uL (ref 0.0–0.1)
Basophils Relative: 0.6 % (ref 0.0–3.0)
Eosinophils Absolute: 0.3 10*3/uL (ref 0.0–0.7)
Eosinophils Relative: 3.1 % (ref 0.0–5.0)
HCT: 43.9 % (ref 39.0–52.0)
HEMOGLOBIN: 14.7 g/dL (ref 13.0–17.0)
LYMPHS ABS: 2.8 10*3/uL (ref 0.7–4.0)
LYMPHS PCT: 33.3 % (ref 12.0–46.0)
MCHC: 33.5 g/dL (ref 30.0–36.0)
MCV: 89.7 fl (ref 78.0–100.0)
MONOS PCT: 9.9 % (ref 3.0–12.0)
Monocytes Absolute: 0.8 10*3/uL (ref 0.1–1.0)
NEUTROS PCT: 53.1 % (ref 43.0–77.0)
Neutro Abs: 4.4 10*3/uL (ref 1.4–7.7)
Platelets: 270 10*3/uL (ref 150.0–400.0)
RBC: 4.89 Mil/uL (ref 4.22–5.81)
RDW: 13.3 % (ref 11.5–15.5)
WBC: 8.4 10*3/uL (ref 4.0–10.5)

## 2016-12-05 LAB — BASIC METABOLIC PANEL
BUN: 14 mg/dL (ref 6–23)
CALCIUM: 9.4 mg/dL (ref 8.4–10.5)
CO2: 31 mEq/L (ref 19–32)
Chloride: 106 mEq/L (ref 96–112)
Creatinine, Ser: 0.88 mg/dL (ref 0.40–1.50)
GFR: 97.07 mL/min (ref >60.00)
GLUCOSE: 110 mg/dL — AB (ref 70–99)
POTASSIUM: 3.7 meq/L (ref 3.5–5.1)
SODIUM: 141 meq/L (ref 135–145)

## 2016-12-05 LAB — TSH: TSH: 3.14 u[IU]/mL (ref 0.35–4.50)

## 2016-12-05 LAB — LIPID PANEL
CHOL/HDL RATIO: 6
Cholesterol: 256 mg/dL — ABNORMAL HIGH (ref 0–200)
HDL: 41.5 mg/dL (ref >39.00)
NONHDL: 214
Triglycerides: 204 mg/dL — ABNORMAL HIGH (ref 0.0–149.0)
VLDL: 40.8 mg/dL — ABNORMAL HIGH (ref 0.0–40.0)

## 2016-12-05 LAB — LDL CHOLESTEROL, DIRECT: Direct LDL: 157 mg/dL

## 2016-12-05 LAB — T4, FREE: FREE T4: 0.75 ng/dL (ref 0.60–1.60)

## 2016-12-05 LAB — T3, FREE: T3 FREE: 3.5 pg/mL (ref 2.3–4.2)

## 2016-12-05 LAB — PSA: PSA: 0.84 ng/mL (ref 0.10–4.00)

## 2016-12-05 NOTE — Progress Notes (Signed)
Pre visit review using our clinic review tool, if applicable. No additional management support is needed unless otherwise documented below in the visit note. 

## 2016-12-05 NOTE — Progress Notes (Signed)
Dr. Frederico Hamman T. Ashle Stief, MD, [facility] and [facility] [place], [address] Phone: [phone] Fax: [phone]  12/05/2016  Patient: Colin Cobb, MRN: [id number], DOB: 15-Jun-1966, 51 y.o.  Primary Physician:  Owens Loffler, MD   Chief Complaint  Patient presents with  . Annual Exam   Subjective:   Colin Cobb is a 51 y.o. pleasant patient who presents with the following:  Preventative Health Maintenance Visit:  Health Maintenance Summary Reviewed and updated, unless pt declines services.  A little hyper, but o/w he is doing well.  Tobacco History Reviewed. Down to 1/3 pack a day Alcohol: No concerns, no excessive use Exercise Habits: Some activity, rec at least 30 mins 5 times a week STD concerns: no risk or activity to increase risk Drug Use: None Encouraged self-testicular check  Health Maintenance  Topic Date Due  . COLONOSCOPY  01/05/2016  . TETANUS/TDAP  12/05/2017 (Originally 09/03/2016)  . INFLUENZA VACCINE  04/03/2017  . HIV Screening  Completed   Immunization History  Administered Date(s) Administered  . Td 09/03/2006   Patient Active Problem List   Diagnosis Date Noted  . WEIGHT LOSS 09/29/2009  . Hypothyroidism 12/27/2008  . TOBACCO USE 12/27/2008  . COMMON MIGRAINE 12/27/2008  . GERD 12/27/2008   Past Medical History:  Diagnosis Date  . GERD (gastroesophageal reflux disease)   . Hypothyroidism   . Leg fracture, right   . Migraines   . Tobacco abuse    Past Surgical History:  Procedure Laterality Date  . APPENDECTOMY  1988   Social History   Social History  . Marital status: Single    Spouse name: N/A  . Number of children: N/A  . 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 Smoking status: Current Every Day Smoker    Packs/day: 0.50    Years: 28.00    Types: Cigarettes  . Smokeless tobacco: Never Used   Comment: 60 pack years  . Alcohol use No  . Drug use: No     Comment: h/o cocaine and MJ, none now  . Sexual activity: Not on file   Other Topics Concern  . Not on file   Social History Narrative  . No narrative on file   Family History  Problem Relation Age of Onset  . Cancer Maternal Grandmother     Breast cancer   No Known Allergies  Medication list has been reviewed and updated.   General: Denies fever, chills, sweats. No significant weight loss. Eyes: Denies blurring,significant itching ENT: Denies earache, sore throat, and hoarseness. Cardiovascular: Denies chest pains, palpitations, dyspnea on exertion Respiratory: Denies cough, dyspnea at rest,wheeezing Breast: no concerns about lumps GI: Denies nausea, vomiting, diarrhea, constipation, change in bowel habits, abdominal pain, melena, hematochezia GU: Denies penile discharge, ED, urinary flow / outflow problems. No STD concerns. Musculoskeletal: Denies back pain, joint pain Derm: Denies rash, itching Neuro: Denies  paresthesias, frequent falls, frequent headaches Psych: Denies depression, anxiety Endocrine: Denies cold intolerance, heat intolerance, polydipsia Heme: Denies enlarged lymph nodes Allergy: No hayfever  Objective:   BP 120/86   Pulse 60   Temp 97.8 F (36.6 C) (Oral)   Ht _0  (1.753 m)   Wt 193 lb 8 oz (87.8 kg)   BMI 28.57 kg/m  Ideal Body Weight: Weight in (lb) to have BMI = 25: 168.9  No exam data present  GEN:  well developed, well nourished, no acute distress Eyes: conjunctiva and lids normal, PERRLA, EOMI ENT: TM clear, nares clear, oral exam WNL Neck: supple, no lymphadenopathy, no thyromegaly, no JVD Pulm: clear to auscultation and percussion, respiratory effort normal CV: regular rate and rhythm, S1-S2, no murmur, rub or gallop, no bruits, peripheral pulses normal and symmetric, no cyanosis, clubbing, edema or varicosities GI: soft, non-tender; no hepatosplenomegaly, masses;  active bowel sounds all quadrants GU: no hernia, testicular mass, penile discharge Lymph: no cervical, axillary or inguinal adenopathy MSK: gait normal, muscle tone and strength WNL, no joint swelling, effusions, discoloration, crepitus  SKIN: clear, good turgor, color WNL, no rashes, lesions, or ulcerations Neuro: normal mental status, normal strength, sensation, and motion Psych: alert; oriented to person, place and time, normally interactive and not anxious or depressed in appearance.  All labs reviewed with patient.  Lipids:    Component Value Date/Time   CHOL 256 (H) 12/05/2016 1524   TRIG 204.0 (H) 12/05/2016 1524   HDL 41.50 12/05/2016 1524   LDLDIRECT 157.0 12/05/2016 1524   VLDL 40.8 (H) 12/05/2016 1524   CHOLHDL 6 12/05/2016 1524   CBC: CBC Latest Ref Rng & Units 12/05/2016 07/13/2015 12/24/2013  WBC 4.0 - 10.5 K/uL 8.4 7.4 7.8  Hemoglobin 13.0 - 17.0 g/dL 14.7 14.5 14.4  Hematocrit 39.0 - 52.0 % 43.9 44.8 43.4  Platelets 150.0 - 400.0 K/uL 270.0 264.0 371.6    Basic Metabolic Panel:    Component Value Date/Time   NA 141 12/05/2016 1524   K 3.7 12/05/2016 1524   CL 106 12/05/2016 1524   CO2 31 12/05/2016 1524   BUN 14 12/05/2016 1524   CREATININE 0.88 12/05/2016 1524   GLUCOSE 110 (H) 12/05/2016 1524   CALCIUM 9.4 12/05/2016 1524   Hepatic Function Latest Ref Rng & Units 12/05/2016 07/13/2015 12/24/2013  Total Protein 6.0 - 8.3 g/dL 7.3 7.2 7.3  Albumin 3.5 - 5.2 g/dL 4.7 4.7 4.3  AST 0 - 37 U/L _0 ALT 0 - 53 U/L _1 Alk Phosphatase 39 - 117 U/L 47 49 44  Total Bilirubin 0.2 - 1.2 mg/dL 0.3 0.3 0.4  Bilirubin, Direct 0.0 - 0.3 mg/dL 0.0 0.0 0.0    Lab Results  Component Value Date   TSH 3.14 12/05/2016   Lab Results  Component Value Date   PSA 0.84 12/05/2016   PSA 0.58 07/13/2015   PSA 0.69 12/24/2013    Assessment and Plan:   Healthcare maintenance  Other specified hypothyroidism - Plan: TSH, T4, free, T3, free  Screen for colon  cancer  Screening PSA (prostate specific antigen) - Plan: PSA  Screening, lipid - Plan: Lipid panel  Other fatigue - Plan: Basic metabolic panel, CBC with Differential/Platelet, Hepatic function panel  Health Maintenance Exam: The patient's preventative maintenance and recommended screening tests for an annual wellness exam were reviewed in full today. Brought up to date unless services declined.  Counselled on the importance of diet, exercise, and its role in overall health and mortality. The patient's FH and SH was reviewed, including their home life, tobacco status, and drug and alcohol status.  Follow-up in 1 year for physical exam or additional follow-up below.  Follow-up: No Follow-up on file. Or follow-up in 1 year if not noted.  Orders Placed This Encounter  Procedures  . Basic metabolic panel  . CBC with Differential/Platelet  . Hepatic function panel  . Lipid panel  . PSA  . TSH  . T4,  free  . T3, free  . LDL cholesterol, direct    Signed,  Pihu Basil T. Silvina Hackleman, MD   Allergies as of 12/05/2016   No Known Allergies     Medication List       Accurate as of 12/05/16 11:59 PM. Always use your most recent med list.          levothyroxine 112 MCG tablet Commonly known as:  SYNTHROID, LEVOTHROID TAKE 1 TABLET (112 MCG TOTAL) BY MOUTH DAILY BEFORE BREAKFAST.

## 2016-12-06 ENCOUNTER — Encounter: Payer: Self-pay | Admitting: *Deleted

## 2016-12-06 MED ORDER — LEVOTHYROXINE SODIUM 112 MCG PO TABS: 112.0000 ug | ORAL_TABLET | Freq: Every day | ORAL | 3 refills | Status: DC

## 2017-09-03 HISTORY — PX: POLYPECTOMY: SHX149

## 2017-09-03 HISTORY — PX: COLONOSCOPY: SHX174

## 2017-12-23 ENCOUNTER — Other Ambulatory Visit: Payer: Self-pay | Admitting: Family Medicine

## 2017-12-23 DIAGNOSIS — E038 Other specified hypothyroidism: Secondary | ICD-10-CM

## 2017-12-29 NOTE — Progress Notes (Signed)
Dr. Frederico Hamman T. Anisia Leija, MD, [facility] Primary Care and Sports Medicine [place], [address] Phone: [phone] Fax: [phone]  12/30/2017  Patient: Colin Cobb, MRN: [id number], DOB: 1966/07/17, 52 y.o.  Primary Physician:  Owens Loffler, MD   Chief Complaint  Patient presents with  . Annual Exam   Subjective:   DIO GILLER is a 52 y.o. pleasant patient who presents with the following:  Preventative Health Maintenance Visit:  Health Maintenance Summary Reviewed and updated, unless pt declines services.  Tobacco History Reviewed. Alcohol: No concerns, no excessive use Exercise Habits: Some activity, rec at least 30 mins 5 times a week STD concerns: no risk or activity to increase risk Drug Use: None Encouraged self-testicular check  Knot on back.  Smoking 1 pack per week  Wt Readings from Last 3 Encounters:  12/30/17 187 lb 8 oz (85 kg)  12/05/16 193 lb 8 oz (87.8 kg)  07/13/15 182 lb 8 oz (82.8 kg)    Health Maintenance  Topic Date Due  . COLONOSCOPY  01/05/2016  . TETANUS/TDAP  12/31/2018 (Originally 09/03/2016)  . INFLUENZA VACCINE  04/03/2018  . HIV Screening  Completed   Immunization History  Administered Date(s) Administered  . Td 09/03/2006   Patient Active Problem List   Diagnosis Date Noted  . WEIGHT LOSS 09/29/2009  . Hypothyroidism 12/27/2008  . TOBACCO USE 12/27/2008  . COMMON MIGRAINE 12/27/2008  . GERD 12/27/2008   Past Medical History:  Diagnosis Date  . GERD (gastroesophageal reflux disease)   . Hypothyroidism   . Leg fracture, right   . Migraines   . Tobacco abuse    Past Surgical History:  Procedure Laterality Date  . APPENDECTOMY  1988   Social History   Socioeconomic History  . Marital status: Single    Spouse name: Not on file  . Number of children: Not on file  . Years of education: Not on file  . Highest education level: Not on file  Occupational History  . Occupation: Leisure centre manager: [employer]  Social Needs  . Financial resource strain: Not on file  . Food insecurity:    Worry: Not on file    Inability: Not on file  . Transportation needs:    Medical: Not on file    Non-medical: Not on file  Tobacco Use  . Smoking status: Current Every Day Smoker    Packs/day: 0.50    Years: 28.00    Pack years: 14.00    Types: Cigarettes  . Smokeless tobacco: Never Used  . Tobacco comment: 60 pack years  Substance and Sexual Activity  . Alcohol use: No  . Drug use: No    Comment: h/o cocaine and MJ, none now  . Sexual activity: Not on file  Lifestyle  . Physical activity:    Days per week: Not on file    Minutes per session: Not on file  . Stress: Not on file  Relationships  . Social connections:    Talks on phone: Not on file    Gets together: Not on file    Attends religious service: Not on file    Active member of club or organization: Not on file    Attends meetings of clubs or organizations: Not on file    Relationship status: Not on file  . Intimate partner violence:    Fear of current or ex partner: Not on file    Emotionally abused: Not on  file    Physically abused: Not on file    Forced sexual activity: Not on file  Other Topics Concern  . Not on file  Social History Narrative  . Not on file   Family History  Problem Relation Age of Onset  . Cancer Maternal Grandmother        Breast cancer   No Known Allergies  Medication list has been reviewed and updated.   General: Denies fever, chills, sweats. No significant weight loss. Eyes: Denies blurring,significant itching ENT: Denies earache, sore throat, and hoarseness. Cardiovascular: Denies chest pains, palpitations, dyspnea on exertion Respiratory: Denies cough, dyspnea at rest,wheeezing Breast: no concerns about lumps GI: Denies nausea, vomiting, diarrhea, constipation, change in bowel habits, abdominal pain, melena, hematochezia GU: Denies penile discharge, ED, urinary  flow / outflow problems. No STD concerns. Musculoskeletal: Denies back pain, joint pain Derm: Denies rash, itching Neuro: Denies  paresthesias, frequent falls, frequent headaches Psych: Denies depression, anxiety Endocrine: Denies cold intolerance, heat intolerance, polydipsia Heme: Denies enlarged lymph nodes Allergy: No hayfever  Objective:   BP 110/74   Pulse 74   Temp 97.8 F (36.6 C) (Oral)   Ht '5\' 9"'  (1.753 m)   Wt 187 lb 8 oz (85 kg)   BMI 27.69 kg/m  Ideal Body Weight: Weight in (lb) to have BMI = 25: 168.9  No exam data present  GEN: well developed, well nourished, no acute distress Eyes: conjunctiva and lids normal, PERRLA, EOMI ENT: TM clear, nares clear, oral exam WNL Neck: supple, no lymphadenopathy, no thyromegaly, no JVD Pulm: clear to auscultation and percussion, respiratory effort normal CV: regular rate and rhythm, S1-S2, no murmur, rub or gallop, no bruits, peripheral pulses normal and symmetric, no cyanosis, clubbing, edema or varicosities GI: soft, non-tender; no hepatosplenomegaly, masses; active bowel sounds all quadrants GU: no hernia, testicular mass, penile discharge Lymph: no cervical, axillary or inguinal adenopathy MSK: gait normal, muscle tone and strength WNL, no joint swelling, effusions, discoloration, crepitus  SKIN: clear, good turgor, color WNL, no rashes, lesions, or ulcerations. SEBACEOUS CYST ON BACK Neuro: normal mental status, normal strength, sensation, and motion Psych: alert; oriented to person, place and time, normally interactive and not anxious or depressed in appearance. All labs reviewed with patient.  Lipids:    Component Value Date/Time   CHOL 256 (H) 12/05/2016 1524   TRIG 204.0 (H) 12/05/2016 1524   HDL 41.50 12/05/2016 1524   LDLDIRECT 157.0 12/05/2016 1524   VLDL 40.8 (H) 12/05/2016 1524   CHOLHDL 6 12/05/2016 1524   CBC: CBC Latest Ref Rng & Units 12/05/2016 07/13/2015 12/24/2013  WBC 4.0 - 10.5 K/uL 8.4 7.4 7.8    Hemoglobin 13.0 - 17.0 g/dL 14.7 14.5 14.4  Hematocrit 39.0 - 52.0 % 43.9 44.8 43.4  Platelets 150.0 - 400.0 K/uL 270.0 264.0 631.4    Basic Metabolic Panel:    Component Value Date/Time   NA 141 12/05/2016 1524   K 3.7 12/05/2016 1524   CL 106 12/05/2016 1524   CO2 31 12/05/2016 1524   BUN 14 12/05/2016 1524   CREATININE 0.88 12/05/2016 1524   GLUCOSE 110 (H) 12/05/2016 1524   CALCIUM 9.4 12/05/2016 1524   Hepatic Function Latest Ref Rng & Units 12/05/2016 07/13/2015 12/24/2013  Total Protein 6.0 - 8.3 g/dL 7.3 7.2 7.3  Albumin 3.5 - 5.2 g/dL 4.7 4.7 4.3  AST 0 - 37 U/L '13 14 16  ' ALT 0 - 53 U/L '17 11 14  ' Alk Phosphatase 39 - 117  U/L 47 49 44  Total Bilirubin 0.2 - 1.2 mg/dL 0.3 0.3 0.4  Bilirubin, Direct 0.0 - 0.3 mg/dL 0.0 0.0 0.0    Lab Results  Component Value Date   TSH 3.14 12/05/2016   Lab Results  Component Value Date   PSA 0.84 12/05/2016   PSA 0.58 07/13/2015   PSA 0.69 12/24/2013    Assessment and Plan:   Healthcare maintenance - Plan: Basic metabolic panel, CBC with Differential/Platelet, Hepatic function panel, Lipid panel, PSA  Other specified hypothyroidism - Plan: levothyroxine (SYNTHROID, LEVOTHROID) 112 MCG tablet, TSH, T3, free, T4, free  Screen for colon cancer - Plan: Ambulatory referral to Gastroenterology   Health Maintenance Exam: The patient's preventative maintenance and recommended screening tests for an annual wellness exam were reviewed in full today. Brought up to date unless services declined.  Counselled on the importance of diet, exercise, and its role in overall health and mortality. The patient's FH and SH was reviewed, including their home life, tobacco status, and drug and alcohol status.  Follow-up in 1 year for physical exam or additional follow-up below.  Follow-up: No follow-ups on file. Or follow-up in 1 year if not noted.  Signed,  [name]. Taliana Mersereau, MD   Allergies as of 12/30/2017   No Known Allergies      Medication List        Accurate as of 12/30/17  2:53 PM. Always use your most recent med list.          levothyroxine 112 MCG tablet Commonly known as:  SYNTHROID, LEVOTHROID TAKE 1 TABLET (112 MCG TOTAL) BY MOUTH DAILY BEFORE BREAKFAST.

## 2017-12-30 ENCOUNTER — Encounter: Payer: Self-pay | Admitting: Family Medicine

## 2017-12-30 ENCOUNTER — Ambulatory Visit (INDEPENDENT_AMBULATORY_CARE_PROVIDER_SITE_OTHER): Payer: BLUE CROSS/BLUE SHIELD | Admitting: Family Medicine

## 2017-12-30 ENCOUNTER — Encounter: Payer: Self-pay | Admitting: Gastroenterology

## 2017-12-30 ENCOUNTER — Other Ambulatory Visit: Payer: Self-pay

## 2017-12-30 VITALS — BP 110/74 | HR 74 | Temp 97.8°F | Ht 69.0 in | Wt 187.5 lb

## 2017-12-30 DIAGNOSIS — Z0000 Encounter for general adult medical examination without abnormal findings: Secondary | ICD-10-CM | POA: Diagnosis not present

## 2017-12-30 DIAGNOSIS — E038 Other specified hypothyroidism: Secondary | ICD-10-CM

## 2017-12-30 DIAGNOSIS — Z1211 Encounter for screening for malignant neoplasm of colon: Secondary | ICD-10-CM

## 2017-12-30 MED ORDER — LEVOTHYROXINE SODIUM 112 MCG PO TABS: 112.0000 ug | ORAL_TABLET | Freq: Every day | ORAL | 3 refills | Status: DC

## 2017-12-30 NOTE — Patient Instructions (Signed)

## 2017-12-31 LAB — LIPID PANEL
CHOL/HDL RATIO: 5
CHOLESTEROL: 218 mg/dL — AB (ref 0–200)
HDL: 43.6 mg/dL (ref >39.00)
LDL CALC: 150 mg/dL — AB (ref 0–99)
NONHDL: 174.72
Triglycerides: 126 mg/dL (ref 0.0–149.0)
VLDL: 25.2 mg/dL (ref 0.0–40.0)

## 2017-12-31 LAB — HEPATIC FUNCTION PANEL
ALT: 9 U/L (ref 0–53)
AST: 11 U/L (ref 0–37)
Albumin: 4.9 g/dL (ref 3.5–5.2)
Alkaline Phosphatase: 47 U/L (ref 39–117)
BILIRUBIN TOTAL: 0.4 mg/dL (ref 0.2–1.2)
Bilirubin, Direct: 0.1 mg/dL (ref 0.0–0.3)
Total Protein: 7.3 g/dL (ref 6.0–8.3)

## 2017-12-31 LAB — CBC WITH DIFFERENTIAL/PLATELET
BASOS ABS: 0.1 10*3/uL (ref 0.0–0.1)
Basophils Relative: 0.8 % (ref 0.0–3.0)
EOS ABS: 0.2 10*3/uL (ref 0.0–0.7)
Eosinophils Relative: 2.4 % (ref 0.0–5.0)
HEMATOCRIT: 43 % (ref 39.0–52.0)
Hemoglobin: 14.3 g/dL (ref 13.0–17.0)
LYMPHS PCT: 33.4 % (ref 12.0–46.0)
Lymphs Abs: 2.9 10*3/uL (ref 0.7–4.0)
MCHC: 33.2 g/dL (ref 30.0–36.0)
MCV: 89.8 fl (ref 78.0–100.0)
Monocytes Absolute: 0.8 10*3/uL (ref 0.1–1.0)
Monocytes Relative: 9.6 % (ref 3.0–12.0)
Neutro Abs: 4.7 10*3/uL (ref 1.4–7.7)
Neutrophils Relative %: 53.8 % (ref 43.0–77.0)
Platelets: 273 10*3/uL (ref 150.0–400.0)
RBC: 4.79 Mil/uL (ref 4.22–5.81)
RDW: 13.4 % (ref 11.5–15.5)
WBC: 8.7 10*3/uL (ref 4.0–10.5)

## 2017-12-31 LAB — BASIC METABOLIC PANEL
BUN: 13 mg/dL (ref 6–23)
CALCIUM: 9.6 mg/dL (ref 8.4–10.5)
CO2: 29 mEq/L (ref 19–32)
CREATININE: 0.87 mg/dL (ref 0.40–1.50)
Chloride: 103 mEq/L (ref 96–112)
GFR: 97.94 mL/min (ref >60.00)
Glucose, Bld: 87 mg/dL (ref 70–99)
Potassium: 4.6 mEq/L (ref 3.5–5.1)
Sodium: 140 mEq/L (ref 135–145)

## 2017-12-31 LAB — TSH: TSH: 4.31 u[IU]/mL (ref 0.35–4.50)

## 2017-12-31 LAB — T4, FREE: Free T4: 0.84 ng/dL (ref 0.60–1.60)

## 2017-12-31 LAB — T3, FREE: T3, Free: 3.1 pg/mL (ref 2.3–4.2)

## 2017-12-31 LAB — PSA: PSA: 0.66 ng/mL (ref 0.10–4.00)

## 2018-01-02 ENCOUNTER — Encounter: Payer: Self-pay | Admitting: *Deleted

## 2018-02-07 ENCOUNTER — Encounter: Payer: Self-pay | Admitting: *Deleted

## 2018-02-25 ENCOUNTER — Ambulatory Visit (AMBULATORY_SURGERY_CENTER): Payer: Self-pay

## 2018-02-25 ENCOUNTER — Other Ambulatory Visit: Payer: Self-pay

## 2018-02-25 VITALS — Ht 68.0 in | Wt 187.0 lb

## 2018-02-25 DIAGNOSIS — Z1211 Encounter for screening for malignant neoplasm of colon: Secondary | ICD-10-CM

## 2018-02-25 MED ORDER — PEG-KCL-NACL-NASULF-NA ASC-C 140 G PO SOLR: 1.0000 | Freq: Once | ORAL | 0 refills | Status: AC

## 2018-02-25 NOTE — Progress Notes (Signed)
No egg or soy allergy known to patient  No issues with past sedation with any surgeries  or procedures, no intubation problems  No diet pills per patient No home 02 use per patient  No blood thinners per patient  Pt denies issues with constipation  No A fib or A flutter  EMMI video sent to pt's e mail sent to email

## 2018-02-28 ENCOUNTER — Encounter: Payer: Self-pay | Admitting: Gastroenterology

## 2018-03-11 ENCOUNTER — Encounter: Payer: Self-pay | Admitting: Gastroenterology

## 2018-03-11 ENCOUNTER — Ambulatory Visit (AMBULATORY_SURGERY_CENTER): Payer: BLUE CROSS/BLUE SHIELD | Admitting: Gastroenterology

## 2018-03-11 VITALS — BP 112/70 | HR 65 | Temp 99.1°F | Resp 13 | Ht 69.0 in | Wt 187.0 lb

## 2018-03-11 DIAGNOSIS — Z1211 Encounter for screening for malignant neoplasm of colon: Secondary | ICD-10-CM

## 2018-03-11 DIAGNOSIS — D122 Benign neoplasm of ascending colon: Secondary | ICD-10-CM | POA: Diagnosis not present

## 2018-03-11 DIAGNOSIS — D123 Benign neoplasm of transverse colon: Secondary | ICD-10-CM | POA: Diagnosis not present

## 2018-03-11 DIAGNOSIS — D120 Benign neoplasm of cecum: Secondary | ICD-10-CM

## 2018-03-11 MED ORDER — SODIUM CHLORIDE 0.9 % IV SOLN: 500.0000 mL | Freq: Once | INTRAVENOUS | Status: DC

## 2018-03-11 NOTE — Patient Instructions (Signed)
YOU HAD AN ENDOSCOPIC PROCEDURE TODAY AT THE [facility]:   Refer to the procedure report that was given to you for any specific questions about what was found during the examination.  If the procedure report does not answer your questions, please call your gastroenterologist to clarify.  If you requested that your care partner not be given the details of your procedure findings, then the procedure report has been included in a sealed envelope for you to review at your convenience later.  YOU SHOULD EXPECT: Some feelings of bloating in the abdomen. Passage of more gas than usual.  Walking can help get rid of the air that was put into your GI tract during the procedure and reduce the bloating. If you had a lower endoscopy (such as a colonoscopy or flexible sigmoidoscopy) you may notice spotting of blood in your stool or on the toilet paper. If you underwent a bowel prep for your procedure, you may not have a normal bowel movement for a few days.  Please Note:  You might notice some irritation and congestion in your nose or some drainage.  This is from the oxygen used during your procedure.  There is no need for concern and it should clear up in a day or so.  SYMPTOMS TO REPORT IMMEDIATELY:   Following lower endoscopy (colonoscopy or flexible sigmoidoscopy):  Excessive amounts of blood in the stool  Significant tenderness or worsening of abdominal pains  Swelling of the abdomen that is new, acute  Fever of 100F or higher  For urgent or emergent issues, a gastroenterologist can be reached at any hour by calling [phone].   DIET:  We do recommend a small meal at first, but then you may proceed to your regular diet.  Drink plenty of fluids but you should avoid alcoholic beverages for 24 hours.  MEDICATIONS: Continue present medications.  Please see handouts given to you by your recovery nurse.  ACTIVITY:  You should plan to take it easy for the rest of today and you should NOT  DRIVE or use heavy machinery until tomorrow (because of the sedation medicines used during the test).    FOLLOW UP: Our staff will call the number listed on your records the next business day following your procedure to check on you and address any questions or concerns that you may have regarding the information given to you following your procedure. If we do not reach you, we will leave a message.  However, if you are feeling well and you are not experiencing any problems, there is no need to return our call.  We will assume that you have returned to your regular daily activities without incident.  If any biopsies were taken you will be contacted by phone or by letter within the next 1-3 weeks.  Please call us at [phone] if you have not heard about the biopsies in 3 weeks.   Thank you for allowing us to provide for your healthcare needs today.   SIGNATURES/CONFIDENTIALITY: You and/or your care partner have signed paperwork which will be entered into your electronic medical record.  These signatures attest to the fact that that the information above on your After Visit Summary has been reviewed and is understood.  Full responsibility of the confidentiality of this discharge information lies with you and/or your care-partner. 

## 2018-03-11 NOTE — Progress Notes (Signed)
Pt's states no medical or surgical changes since previsit or office visit. 

## 2018-03-11 NOTE — Progress Notes (Signed)
Called to room to assist during endoscopic procedure.  Patient ID and intended procedure confirmed with present staff. Received instructions for my participation in the procedure from the performing physician.  

## 2018-03-11 NOTE — Op Note (Signed)
[facility] Patient Name: Colin Cobb Procedure Date: 03/11/2018 10:49 AM MRN: [id number] Endoscopist: [place]. Loletha Carrow , MD Age: 52 Referring MD:  Date of Birth: 24-Feb-1966 Gender: Male Account #: [id number] Procedure:                Colonoscopy Indications:              Screening for colorectal malignant neoplasm, This                            is the patient's first colonoscopy Medicines:                Monitored Anesthesia Care Procedure:                Pre-Anesthesia Assessment:                           - Prior to the procedure, a History and Physical                            was performed, and patient medications and                            allergies were reviewed. The patient's tolerance of                            previous anesthesia was also reviewed. The risks                            and benefits of the procedure and the sedation                            options and risks were discussed with the patient.                            All questions were answered, and informed consent                            was obtained. Prior Anticoagulants: The patient has                            taken no previous anticoagulant or antiplatelet                            agents. ASA Grade Assessment: II - A patient with                            mild systemic disease. After reviewing the risks                            and benefits, the patient was deemed in                            satisfactory condition to undergo the procedure.  After obtaining informed consent, the colonoscope                            was passed under direct vision. Throughout the                            procedure, the patient's blood pressure, pulse, and                            oxygen saturations were monitored continuously. The                            Model CF-HQ190L [id number]) scope was introduced                            through the anus and  advanced to the the cecum,                            identified by appendiceal orifice and ileocecal                            valve. The colonoscopy was performed without                            difficulty. The patient tolerated the procedure                            well. The quality of the bowel preparation was                            good. The ileocecal valve, appendiceal orifice, and                            rectum were photographed. The quality of the bowel                            preparation was evaluated using the BBPS [facility]                            Bowel Preparation Scale) with scores of: Right                            Colon = 2, Transverse Colon = 2 and Left Colon = 2.                            The total BBPS score equals 6. Scope In: 11:00:44 AM Scope Out: 11:18:21 AM Scope Withdrawal Time: 0 hours 15 minutes 44 seconds  Total Procedure Duration: 0 hours 17 minutes 37 seconds  Findings:                 The perianal and digital rectal examinations were                            normal.  A 6 mm polyp was found in the cecum. The polyp was                            sessile. The polyp was removed with a cold snare.                            Resection and retrieval were complete. (Jar 1)                           A 10 mm polyp was found in the distal ascending                            colon. The polyp was sessile with a mucus cap. The                            polyp was removed with a hot snare. Resection and                            retrieval were complete. (Jar 2)                           A 6 mm polyp was found in the transverse colon. The                            polyp was sessile with a mucus cap. The polyp was                            removed with a cold snare. Resection and retrieval                            were complete. (Jar 2)                           The exam was otherwise without abnormality on                             direct and retroflexion views. Complications:            No immediate complications. Estimated Blood Loss:     Estimated blood loss was minimal. Impression:               - One 6 mm polyp in the cecum, removed with a cold                            snare. Resected and retrieved.                           - One 10 mm polyp in the distal ascending colon,                            removed with a hot snare. Resected and retrieved.                           -  One 6 mm polyp in the transverse colon, removed                            with a cold snare. Resected and retrieved.                           - The examination was otherwise normal on direct                            and retroflexion views. Recommendation:           - Patient has a contact number available for                            emergencies. The signs and symptoms of potential                            delayed complications were discussed with the                            patient. Return to normal activities tomorrow.                            Written discharge instructions were provided to the                            patient.                           - Resume previous diet.                           - Continue present medications.                           - Await pathology results.                           - Repeat colonoscopy is recommended for                            surveillance. The colonoscopy date will be                            determined after pathology results from today's                            exam become available for review. Henry L. Loletha Carrow, MD 03/11/2018 11:22:33 AM This report has been signed electronically.

## 2018-03-11 NOTE — Progress Notes (Signed)
To Pacu, VSS. Report to [email or web address] 

## 2018-03-12 ENCOUNTER — Telehealth: Payer: Self-pay | Admitting: *Deleted

## 2018-03-12 NOTE — Telephone Encounter (Signed)
  Follow up Call-  Call back number 03/11/2018  Post procedure Call Back phone  # [phone]  Permission to leave phone message Yes  Some recent data might be hidden     Patient questions:  Do you have a fever, pain , or abdominal swelling? No. Pain Score  0 *  Have you tolerated food without any problems? Yes.    Have you been able to return to your normal activities? Yes.    Do you have any questions about your discharge instructions: Diet   No. Medications  No. Follow up visit  No.  Do you have questions or concerns about your Care? No.  Actions: * If pain score is 4 or above: No action needed, pain <4.

## 2018-03-14 ENCOUNTER — Encounter: Payer: Self-pay | Admitting: Gastroenterology

## 2019-03-17 NOTE — Progress Notes (Signed)
Colin Cobb T. Rafaella Kole, MD Primary Care and [place] at [facility] [place], [address] Phone: [phone]  FAX: [place] - 53 y.o. male  MRN [id number]  Date of Birth: 1965-11-02  Visit Date: 03/18/2019  PCP: Owens Loffler, MD  Referred by: Owens Loffler, MD  Chief Complaint  Patient presents with  . knot on back    painful  . wants lab work   Subjective:   Colin Cobb is a 53 y.o. very pleasant male patient who presents with the following:  Colin Cobb is here to discuss a spot on his back.  He has had a longstanding sebaceous cyst on his back, and this is grown in size and is now tender to palpate.  He thinks it may have gotten a bug bite over the weekend, and it is a little bit more painful, but not dramatically painful.  He also has longstanding hypothyroidism, and he will need f/u labs  Thyroid: No symptoms. Labs reviewed. Denies cold / heat intolerance, dry skin, hair loss. No goiter.  Lab Results  Component Value Date   TSH 4.31 12/30/2017     Past Medical History, Surgical History, Social History, Family History, Problem List, Medications, and Allergies have been reviewed and updated if relevant.  Patient Active Problem List   Diagnosis Date Noted  . WEIGHT LOSS 09/29/2009  . Hypothyroidism 12/27/2008  . TOBACCO USE 12/27/2008  . COMMON MIGRAINE 12/27/2008  . GERD 12/27/2008    Past Medical History:  Diagnosis Date  . Arthritis    knees  . Fracture of leg    both legs 1982  . GERD (gastroesophageal reflux disease)   . Hypothyroidism   . Leg fracture, right   . Migraines   . Tobacco abuse     Past Surgical History:  Procedure Laterality Date  . APPENDECTOMY  1988    Social History   Socioeconomic History  . Marital status: Single    Spouse name: Not on file  . Number of children: Not on file  . Years of education: Not on file  . Highest education level: Not  on file  Occupational History  . Occupation: Passenger transport manager: [employer]  Social Needs  . Financial resource strain: Not on file  . Food insecurity    Worry: Not on file    Inability: Not on file  . Transportation needs    Medical: Not on file    Non-medical: Not on file  Tobacco Use  . Smoking status: Current Every Day Smoker    Packs/day: 0.50    Years: 28.00    Pack years: 14.00    Types: Cigarettes  . Smokeless tobacco: Never Used  . Tobacco comment: 60 pack years  Substance and Sexual Activity  . Alcohol use: No  . Drug use: No    Comment: h/o cocaine and MJ, none now  . Sexual activity: Not on file  Lifestyle  . Physical activity    Days per week: Not on file    Minutes per session: Not on file  . Stress: Not on file  Relationships  . Social Herbalist on phone: Not on file    Gets together: Not on file    Attends religious service: Not on file    Active member of club or organization: Not on file    Attends meetings of clubs or organizations: Not on file  Relationship status: Not on file  . Intimate partner violence    Fear of current or ex partner: Not on file    Emotionally abused: Not on file    Physically abused: Not on file    Forced sexual activity: Not on file  Other Topics Concern  . Not on file  Social History Narrative  . Not on file    Family History  Problem Relation Age of Onset  . Cancer Maternal Grandmother        Breast cancer  . Breast cancer Maternal Grandmother   . Heart disease Maternal Grandfather   . Colon cancer Neg Hx   . Colon polyps Neg Hx   . Esophageal cancer Neg Hx   . Rectal cancer Neg Hx   . Stomach cancer Neg Hx     No Known Allergies  Medication list reviewed and updated in full in [facility].   GEN: No acute illnesses, no fevers, chills. GI: No n/v/d, eating normally Pulm: No SOB Interactive and getting along well at home.  Otherwise, ROS is as per the HPI.  Objective:    BP 120/76   Pulse 72   Temp 97.9 F (36.6 C) (Skin)   Ht 5\' 9"  (1.753 m)   Wt 182 lb 8 oz (82.8 kg)   SpO2 98%   BMI 26.95 kg/m   GEN: WDWN, NAD, Non-toxic, A & O x 3 HEENT: Atraumatic, Normocephalic. Neck supple. No masses, No LAD. Ears and Nose: No external deformity. CV: RRR, No M/G/R. No JVD. No thrill. No extra heart sounds. PULM: CTA B, no wheezes, crackles, rhonchi. No retractions. No resp. distress. No accessory muscle use. EXTR: No c/c/e NEURO Normal gait.  PSYCH: Normally interactive. Conversant. Not depressed or anxious appearing.  Calm demeanor.  Skin: notable cyst between shoulder blades without significant fluctuance  Laboratory and Imaging Data:  Assessment and Plan:     ICD-10-CM   1. Inflamed sebaceous cyst  L72.3 Ambulatory referral to General Surgery  2. Hypothyroidism, unspecified type  E03.9 T3, free    T4, free    TSH   I do not think that this is drainable at this point, but will cover with abx  F/u thyroid labs  Follow-up: No follow-ups on file.  Meds ordered this encounter  Medications  . sulfamethoxazole-trimethoprim (BACTRIM DS) 800-160 MG tablet    Sig: Take 2 tablets by mouth 2 (two) times daily for 10 days.    Dispense:  40 tablet    Refill:  0   Orders Placed This Encounter  Procedures  . T3, free  . T4, free  . TSH  . Ambulatory referral to General Surgery    Signed,  Frederico Hamman T. Costas Sena, MD   Outpatient Encounter Medications as of 03/18/2019  Medication Sig  . Aspirin-Salicylamide-Caffeine (BC HEADACHE POWDER PO) Take by mouth.  . levothyroxine (SYNTHROID, LEVOTHROID) 112 MCG tablet Take 1 tablet (112 mcg total) by mouth daily before breakfast.  . sulfamethoxazole-trimethoprim (BACTRIM DS) 800-160 MG tablet Take 2 tablets by mouth 2 (two) times daily for 10 days.   Facility-Administered Encounter Medications as of 03/18/2019  Medication  . 0.9 %  sodium chloride infusion

## 2019-03-18 ENCOUNTER — Ambulatory Visit (INDEPENDENT_AMBULATORY_CARE_PROVIDER_SITE_OTHER): Payer: BLUE CROSS/BLUE SHIELD | Admitting: Family Medicine

## 2019-03-18 ENCOUNTER — Other Ambulatory Visit: Payer: Self-pay

## 2019-03-18 ENCOUNTER — Encounter: Payer: Self-pay | Admitting: Family Medicine

## 2019-03-18 VITALS — BP 120/76 | HR 72 | Temp 97.9°F | Ht 69.0 in | Wt 182.5 lb

## 2019-03-18 DIAGNOSIS — L723 Sebaceous cyst: Secondary | ICD-10-CM

## 2019-03-18 DIAGNOSIS — E039 Hypothyroidism, unspecified: Secondary | ICD-10-CM | POA: Diagnosis not present

## 2019-03-18 LAB — T3, FREE: T3, Free: 2.9 pg/mL (ref 2.3–4.2)

## 2019-03-18 LAB — T4, FREE: Free T4: 0.73 ng/dL (ref 0.60–1.60)

## 2019-03-18 LAB — TSH: TSH: 5.16 u[IU]/mL — ABNORMAL HIGH (ref 0.35–4.50)

## 2019-03-18 MED ORDER — SULFAMETHOXAZOLE-TRIMETHOPRIM 800-160 MG PO TABS: 2.0000 | ORAL_TABLET | Freq: Two times a day (BID) | ORAL | 0 refills | Status: AC

## 2019-03-27 ENCOUNTER — Telehealth: Payer: Self-pay | Admitting: Family Medicine

## 2019-03-27 NOTE — Telephone Encounter (Signed)
Called patient regarding Gen Surgery Referral . Patient says the cyst came to a head and busted open and a lot of stuff has come out and now it is barely there. He is still on the Antibiotics and he doesn't think he needs to go to the Surgeon but will go if you think he needs to. Please advise and call the patient with what you recommend

## 2019-03-28 NOTE — Telephone Encounter (Signed)
I think if it already busted and doesn't hurt, then I think he will probably be fine unless it comes back just as bad

## 2019-03-29 ENCOUNTER — Other Ambulatory Visit: Payer: Self-pay | Admitting: Family Medicine

## 2019-03-29 DIAGNOSIS — E038 Other specified hypothyroidism: Secondary | ICD-10-CM

## 2019-03-30 NOTE — Telephone Encounter (Signed)
Mr. Gancarz notified as instructed by telephone.  Referral cancelled.  FYI to [place].

## 2019-03-30 NOTE — Addendum Note (Signed)
Addended by: Carter Kitten on: 03/30/2019 12:51 PM   Modules accepted: Orders

## 2020-03-24 ENCOUNTER — Telehealth: Payer: Self-pay | Admitting: Family Medicine

## 2020-03-24 DIAGNOSIS — E038 Other specified hypothyroidism: Secondary | ICD-10-CM

## 2020-03-24 NOTE — Telephone Encounter (Signed)
Cpx8/18 Pt wanted his labs same day

## 2020-03-24 NOTE — Telephone Encounter (Signed)
Please call and schedule CPE with fasting labs prior with Dr. Copland.  

## 2020-04-18 ENCOUNTER — Other Ambulatory Visit: Payer: BLUE CROSS/BLUE SHIELD

## 2020-04-20 ENCOUNTER — Ambulatory Visit (INDEPENDENT_AMBULATORY_CARE_PROVIDER_SITE_OTHER): Payer: BLUE CROSS/BLUE SHIELD | Admitting: Family Medicine

## 2020-04-20 ENCOUNTER — Encounter: Payer: Self-pay | Admitting: Family Medicine

## 2020-04-20 ENCOUNTER — Other Ambulatory Visit: Payer: Self-pay

## 2020-04-20 VITALS — BP 138/82 | HR 57 | Temp 98.3°F | Ht 69.0 in | Wt 183.5 lb

## 2020-04-20 DIAGNOSIS — Z79899 Other long term (current) drug therapy: Secondary | ICD-10-CM | POA: Diagnosis not present

## 2020-04-20 DIAGNOSIS — Z125 Encounter for screening for malignant neoplasm of prostate: Secondary | ICD-10-CM

## 2020-04-20 DIAGNOSIS — Z131 Encounter for screening for diabetes mellitus: Secondary | ICD-10-CM | POA: Diagnosis not present

## 2020-04-20 DIAGNOSIS — Z2883 Immunization not carried out due to unavailability of vaccine: Secondary | ICD-10-CM

## 2020-04-20 DIAGNOSIS — E038 Other specified hypothyroidism: Secondary | ICD-10-CM | POA: Diagnosis not present

## 2020-04-20 DIAGNOSIS — Z13220 Encounter for screening for lipoid disorders: Secondary | ICD-10-CM | POA: Diagnosis not present

## 2020-04-20 DIAGNOSIS — Z0000 Encounter for general adult medical examination without abnormal findings: Secondary | ICD-10-CM | POA: Diagnosis not present

## 2020-04-20 DIAGNOSIS — R481 Agnosia: Secondary | ICD-10-CM

## 2020-04-20 LAB — BASIC METABOLIC PANEL
BUN: 13 mg/dL (ref 6–23)
CO2: 29 mEq/L (ref 19–32)
Calcium: 9.4 mg/dL (ref 8.4–10.5)
Chloride: 106 mEq/L (ref 96–112)
Creatinine, Ser: 0.8 mg/dL (ref 0.40–1.50)
GFR: 100.63 mL/min (ref >60.00)
Glucose, Bld: 109 mg/dL — ABNORMAL HIGH (ref 70–99)
Potassium: 4.2 mEq/L (ref 3.5–5.1)
Sodium: 141 mEq/L (ref 135–145)

## 2020-04-20 LAB — LIPID PANEL
Cholesterol: 201 mg/dL — ABNORMAL HIGH (ref 0–200)
HDL: 47 mg/dL (ref >39.00)
LDL Cholesterol: 128 mg/dL — ABNORMAL HIGH (ref 0–99)
NonHDL: 154.31
Total CHOL/HDL Ratio: 4
Triglycerides: 134 mg/dL (ref 0.0–149.0)
VLDL: 26.8 mg/dL (ref 0.0–40.0)

## 2020-04-20 LAB — HEPATIC FUNCTION PANEL
ALT: 8 U/L (ref 0–53)
AST: 11 U/L (ref 0–37)
Albumin: 4.7 g/dL (ref 3.5–5.2)
Alkaline Phosphatase: 49 U/L (ref 39–117)
Bilirubin, Direct: 0.1 mg/dL (ref 0.0–0.3)
Total Bilirubin: 0.4 mg/dL (ref 0.2–1.2)
Total Protein: 7.2 g/dL (ref 6.0–8.3)

## 2020-04-20 LAB — T4, FREE: Free T4: 0.76 ng/dL (ref 0.60–1.60)

## 2020-04-20 LAB — T3, FREE: T3, Free: 3.4 pg/mL (ref 2.3–4.2)

## 2020-04-20 LAB — CBC WITH DIFFERENTIAL/PLATELET
Basophils Absolute: 0.1 10*3/uL (ref 0.0–0.1)
Basophils Relative: 0.8 % (ref 0.0–3.0)
Eosinophils Absolute: 0.1 10*3/uL (ref 0.0–0.7)
Eosinophils Relative: 1.6 % (ref 0.0–5.0)
HCT: 42.7 % (ref 39.0–52.0)
Hemoglobin: 13.8 g/dL (ref 13.0–17.0)
Lymphocytes Relative: 24.5 % (ref 12.0–46.0)
Lymphs Abs: 2.1 10*3/uL (ref 0.7–4.0)
MCHC: 32.3 g/dL (ref 30.0–36.0)
MCV: 91.7 fl (ref 78.0–100.0)
Monocytes Absolute: 0.8 10*3/uL (ref 0.1–1.0)
Monocytes Relative: 9.7 % (ref 3.0–12.0)
Neutro Abs: 5.5 10*3/uL (ref 1.4–7.7)
Neutrophils Relative %: 63.4 % (ref 43.0–77.0)
Platelets: 245 10*3/uL (ref 150.0–400.0)
RBC: 4.65 Mil/uL (ref 4.22–5.81)
RDW: 13.8 % (ref 11.5–15.5)
WBC: 8.6 10*3/uL (ref 4.0–10.5)

## 2020-04-20 LAB — TSH: TSH: 3.21 u[IU]/mL (ref 0.35–4.50)

## 2020-04-20 NOTE — Progress Notes (Signed)
Arrian Manson T. Sheral Pfahler, MD, [place] at [facility] [place], [address]  Phone: [phone]  FAX: [phone]  Colin Cobb - 54 y.o. male  MRN [id number]  Date of Birth: Sep 03, 1966  Date: 04/20/2020  PCP: Owens Loffler, MD  Referral: Owens Loffler, MD  Chief Complaint  Patient presents with  . Annual Exam    This visit occurred during the SARS-CoV-2 public health emergency.  Safety protocols were in place, including screening questions prior to the visit, additional usage of staff PPE, and extensive cleaning of exam room while observing appropriate contact time as indicated for disinfecting solutions.   Patient Care Team: Owens Loffler, MD as PCP - General Subjective:   Colin Cobb is a 54 y.o. pleasant patient who presents with the following:  Preventative Health Maintenance Visit:  Health Maintenance Summary Reviewed and updated, unless pt declines services.  Tobacco History Reviewed. 1 pack every few days. Alcohol: No concerns, no excessive use Exercise Habits: Some activity, rec at least 30 mins 5 times a week STD concerns: no risk or activity to increase risk Drug Use: None  Covid vaccine  Deving and with  Puts his wallet on the R   Health Maintenance  Topic Date Due  . COVID-19 Vaccine (1) Never done  . TETANUS/TDAP  09/03/2016  . INFLUENZA VACCINE  04/03/2020  . COLONOSCOPY  03/11/2021  . Hepatitis C Screening  Completed  . HIV Screening  Completed   Immunization History  Administered Date(s) Administered  . Td 09/03/2006   Patient Active Problem List   Diagnosis Date Noted  . WEIGHT LOSS 09/29/2009  . Hypothyroidism 12/27/2008  . TOBACCO USE 12/27/2008  . COMMON MIGRAINE 12/27/2008  . GERD 12/27/2008    Past Medical History:  Diagnosis Date  . Arthritis    knees  . Fracture of leg    both legs 1982  . GERD (gastroesophageal  reflux disease)   . Hypothyroidism   . Leg fracture, right   . Migraines   . Tobacco abuse     Past Surgical History:  Procedure Laterality Date  . APPENDECTOMY  1988    Family History  Problem Relation Age of Onset  . Cancer Maternal Grandmother        Breast cancer  . Breast cancer Maternal Grandmother   . Heart disease Maternal Grandfather   . Colon cancer Neg Hx   . Colon polyps Neg Hx   . Esophageal cancer Neg Hx   . Rectal cancer Neg Hx   . Stomach cancer Neg Hx     Past Medical History, Surgical History, Social History, Family History, Problem List, Medications, and Allergies have been reviewed and updated if relevant.  Review of Systems: Pertinent positives are listed above.  Otherwise, a full 14 point review of systems has been done in full and it is negative except where it is noted positive.  Objective:   BP 138/82   Pulse (!) 57   Temp 98.3 F (36.8 C) (Temporal)   Ht 5\' 9"  (1.753 m)   Wt 183 lb 8 oz (83.2 kg)   SpO2 97%   BMI 27.10 kg/m  Ideal Body Weight: Weight in (lb) to have BMI = 25: 168.9  Ideal Body Weight: Weight in (lb) to have BMI = 25: 168.9 No exam data present Depression screen [facility] 2/9 04/20/2020 12/30/2017  Decreased Interest 0 0  Down, Depressed, Hopeless 0 0  PHQ - 2 Score 0 0     GEN: well developed, well nourished, no acute distress Eyes: conjunctiva and lids normal, PERRLA, EOMI ENT: TM clear, nares clear, oral exam WNL Neck: supple, no lymphadenopathy, no thyromegaly, no JVD Pulm: clear to auscultation and percussion, respiratory effort normal CV: regular rate and rhythm, S1-S2, no murmur, rub or gallop, no bruits, peripheral pulses normal and symmetric, no cyanosis, clubbing, edema or varicosities GI: soft, non-tender; no hepatosplenomegaly, masses; active bowel sounds all quadrants GU: no hernia, testicular mass, penile discharge Lymph: no cervical, axillary or inguinal adenopathy MSK: gait normal, muscle tone and strength  WNL, no joint swelling, effusions, discoloration, crepitus  SKIN: clear, good turgor, color WNL, no rashes, lesions, or ulcerations Neuro: normal mental status, normal strength, sensation, and motion Psych: alert; oriented to person, place and time, normally interactive and not anxious or depressed in appearance.  All labs reviewed with patient. Results for orders placed or performed in visit on 03/18/19  T3, free  Result Value Ref Range   T3, Free 2.9 2.3 - 4.2 pg/mL  T4, free  Result Value Ref Range   Free T4 0.73 0.60 - 1.60 ng/dL  TSH  Result Value Ref Range   TSH 5.16 (H) 0.35 - 4.50 uIU/mL    Assessment and Plan:     ICD-10-CM   1. Healthcare maintenance  Z00.00   2. Other specified hypothyroidism  E03.8 T3, free    T4, free    TSH  3. Screening cholesterol level  Z13.220   4. Screening for diabetes mellitus (DM)  Z76.7 Basic metabolic panel    Lipid panel  5. Screening PSA (prostate specific antigen)  Z12.5 PSA, Total with Reflex to PSA, Free  6. Encounter for long-term current use of medication  H41.937 Basic metabolic panel    CBC with Differential/Platelet    Hepatic function panel  7. Loss of perception for taste  R48.1 SARS CoV2 Serology(COVID19) AB(IgG,IgM),Immunoassay  8. COVID-19 virus vaccine not available  Z28.83 SARS CoV2 Serology(COVID19) AB(IgG,IgM),Immunoassay   He declines Covid and Tdap vaccines. The patient declines routine health maintenance services noted. We reviewed that could lead to missing significant problems that could affect there mortality. The patient indicated that they understood this and was willing to accept those risks.   Health Maintenance Exam: The patient's preventative maintenance and recommended screening tests for an annual wellness exam were reviewed in full today. Brought up to date unless services declined.  Counselled on the importance of diet, exercise, and its role in overall health and mortality. The patient's FH and SH  was reviewed, including their home life, tobacco status, and drug and alcohol status.  Follow-up in 1 year for physical exam or additional follow-up below.  Follow-up: No follow-ups on file. Or follow-up in 1 year if not noted.  No orders of the defined types were placed in this encounter.  Medications Discontinued During This Encounter  Medication Reason  . 0.9 %  sodium chloride infusion Completed Course   Orders Placed This Encounter  Procedures  . SARS CoV2 Serology(COVID19) AB(IgG,IgM),Immunoassay  . T3, free  . T4, free  . TSH  . Basic metabolic panel  . CBC with Differential/Platelet  . Hepatic function panel  . Lipid panel  . PSA, Total with Reflex to PSA, Free    Signed,  Jahmez Bily T. Tyshawn Ciullo, MD   Allergies as of 04/20/2020   No Known Allergies     Medication List       Accurate  as of April 20, 2020  9:05 AM. If you have any questions, ask your nurse or doctor.        BC HEADACHE POWDER PO Take by mouth.   levothyroxine 112 MCG tablet Commonly known as: SYNTHROID TAKE 1 TABLET (112 MCG TOTAL) BY MOUTH DAILY BEFORE BREAKFAST.

## 2020-04-21 LAB — SARS COV-2 SEROLOGY(COVID-19)AB(IGG,IGM),IMMUNOASSAY
SARS CoV-2 AB IgG: POSITIVE — AB
SARS CoV-2 IgM: POSITIVE — AB

## 2020-04-21 LAB — PSA, TOTAL WITH REFLEX TO PSA, FREE: PSA, Total: 0.7 ng/mL (ref <=4.0)

## 2020-04-25 ENCOUNTER — Encounter: Payer: Self-pay | Admitting: *Deleted

## 2020-06-17 ENCOUNTER — Other Ambulatory Visit: Payer: Self-pay | Admitting: Family Medicine

## 2020-06-17 DIAGNOSIS — E038 Other specified hypothyroidism: Secondary | ICD-10-CM

## 2021-04-12 ENCOUNTER — Encounter: Payer: Self-pay | Admitting: Gastroenterology

## 2021-04-20 ENCOUNTER — Encounter: Payer: Self-pay | Admitting: Gastroenterology

## 2021-06-07 ENCOUNTER — Ambulatory Visit (AMBULATORY_SURGERY_CENTER): Payer: Self-pay

## 2021-06-07 ENCOUNTER — Other Ambulatory Visit: Payer: Self-pay

## 2021-06-07 VITALS — Ht 69.5 in | Wt 184.0 lb

## 2021-06-07 DIAGNOSIS — Z86010 Personal history of colonic polyps: Secondary | ICD-10-CM

## 2021-06-07 MED ORDER — NA SULFATE-K SULFATE-MG SULF 17.5-3.13-1.6 GM/177ML PO SOLN: 1.0000 | Freq: Once | ORAL | 0 refills | Status: AC

## 2021-06-07 NOTE — Progress Notes (Signed)
Pre visit completed via phone call; Patient verified name, DOB, and address; No egg or soy allergy known to patient  No issues known to pt with past sedation with any surgeries or procedures Patient denies ever being told they had issues or difficulty with intubation  No FH of Malignant Hyperthermia Pt is not on diet pills Pt is not on home 02  Pt is not on blood thinners  Pt denies issues with constipation  No A fib or A flutter NO PA's for preps discussed with pt in PV today  Discussed with pt there will be an out-of-pocket cost for prep and that varies from $0 to 70 +  dollars - pt verbalized understanding  Due to the COVID-19 pandemic we are asking patients to follow certain guidelines in PV and the [place]   Pt aware of COVID protocols and [facility] guidelines

## 2021-06-12 ENCOUNTER — Other Ambulatory Visit: Payer: Self-pay | Admitting: Family Medicine

## 2021-06-12 DIAGNOSIS — E038 Other specified hypothyroidism: Secondary | ICD-10-CM

## 2021-06-20 ENCOUNTER — Encounter: Payer: Self-pay | Admitting: Certified Registered Nurse Anesthetist

## 2021-06-21 ENCOUNTER — Encounter: Payer: Self-pay | Admitting: Gastroenterology

## 2021-06-21 ENCOUNTER — Ambulatory Visit (AMBULATORY_SURGERY_CENTER): Payer: 59 | Admitting: Gastroenterology

## 2021-06-21 ENCOUNTER — Other Ambulatory Visit: Payer: Self-pay

## 2021-06-21 VITALS — BP 124/84 | HR 70 | Temp 98.6°F | Resp 20 | Ht 69.0 in | Wt 184.0 lb

## 2021-06-21 DIAGNOSIS — D128 Benign neoplasm of rectum: Secondary | ICD-10-CM | POA: Diagnosis not present

## 2021-06-21 DIAGNOSIS — D127 Benign neoplasm of rectosigmoid junction: Secondary | ICD-10-CM | POA: Diagnosis not present

## 2021-06-21 DIAGNOSIS — Z86010 Personal history of colonic polyps: Secondary | ICD-10-CM

## 2021-06-21 DIAGNOSIS — D125 Benign neoplasm of sigmoid colon: Secondary | ICD-10-CM | POA: Diagnosis not present

## 2021-06-21 MED ORDER — SODIUM CHLORIDE 0.9 % IV SOLN: 500.0000 mL | Freq: Once | INTRAVENOUS | Status: DC

## 2021-06-21 NOTE — Patient Instructions (Signed)
Discharge instructions given. Handouts on polyps and Diverticulosis. Resume previous medications. YOU HAD AN ENDOSCOPIC PROCEDURE TODAY AT [facility]:   Refer to the procedure report that was given to you for any specific questions about what was found during the examination.  If the procedure report does not answer your questions, please call your gastroenterologist to clarify.  If you requested that your care partner not be given the details of your procedure findings, then the procedure report has been included in a sealed envelope for you to review at your convenience later.  YOU SHOULD EXPECT: Some feelings of bloating in the abdomen. Passage of more gas than usual.  Walking can help get rid of the air that was put into your GI tract during the procedure and reduce the bloating. If you had a lower endoscopy (such as a colonoscopy or flexible sigmoidoscopy) you may notice spotting of blood in your stool or on the toilet paper. If you underwent a bowel prep for your procedure, you may not have a normal bowel movement for a few days.  Please Note:  You might notice some irritation and congestion in your nose or some drainage.  This is from the oxygen used during your procedure.  There is no need for concern and it should clear up in a day or so.  SYMPTOMS TO REPORT IMMEDIATELY:  Following lower endoscopy (colonoscopy or flexible sigmoidoscopy):  Excessive amounts of blood in the stool  Significant tenderness or worsening of abdominal pains  Swelling of the abdomen that is new, acute  Fever of 100F or higher   For urgent or emergent issues, a gastroenterologist can be reached at any hour by calling [phone]. Do not use MyChart messaging for urgent concerns.    DIET:  We do recommend a small meal at first, but then you may proceed to your regular diet.  Drink plenty of fluids but you should avoid alcoholic beverages for 24 hours.  ACTIVITY:  You should plan to take it  easy for the rest of today and you should NOT DRIVE or use heavy machinery until tomorrow (because of the sedation medicines used during the test).    FOLLOW UP: Our staff will call the number listed on your records 48-72 hours following your procedure to check on you and address any questions or concerns that you may have regarding the information given to you following your procedure. If we do not reach you, we will leave a message.  We will attempt to reach you two times.  During this call, we will ask if you have developed any symptoms of COVID 19. If you develop any symptoms (ie: fever, flu-like symptoms, shortness of breath, cough etc.) before then, please call [phone].  If you test positive for Covid 19 in the 2 weeks post procedure, please call and report this information to [place].    If any biopsies were taken you will be contacted by phone or by letter within the next 1-3 weeks.  Please call us at [phone] if you have not heard about the biopsies in 3 weeks.    SIGNATURES/CONFIDENTIALITY: You and/or your care partner have signed paperwork which will be entered into your electronic medical record.  These signatures attest to the fact that that the information above on your After Visit Summary has been reviewed and is understood.  Full responsibility of the confidentiality of this discharge information lies with you and/or your care-partner.

## 2021-06-21 NOTE — Progress Notes (Signed)
Report given to PACU, vss 

## 2021-06-21 NOTE — Op Note (Signed)
Colin Cobb Name: Colin Cobb Procedure Date: 06/21/2021 8:40 AM MRN: [id number] Endoscopist: Mallie Mussel L. Colin Cobb , MD Age: 55 Referring MD:  Date of Birth: 1966-01-02 Gender: Male Account #: [id number] Procedure:                Colonoscopy Indications:              Surveillance: Personal history of adenomatous                            polyps on last colonoscopy 3 years ago                           TA and SSP x 2 in July 2019 Medicines:                Monitored Anesthesia Care Procedure:                Pre-Anesthesia Assessment:                           - Prior to the procedure, a History and Physical                            was performed, and Cobb medications and                            allergies were reviewed. The Cobb's tolerance of                            previous anesthesia was also reviewed. The risks                            and benefits of the procedure and the sedation                            options and risks were discussed with the Cobb.                            All questions were answered, and informed consent                            was obtained. Prior Anticoagulants: The Cobb has                            taken no previous anticoagulant or antiplatelet                            agents. ASA Grade Assessment: II - A Cobb with                            mild systemic disease. After reviewing the risks                            and benefits, the Cobb was deemed in  satisfactory condition to undergo the procedure.                           After obtaining informed consent, the colonoscope                            was passed under direct vision. Throughout the                            procedure, the Cobb's blood pressure, pulse, and                            oxygen saturations were monitored continuously. The                            CF [id number] #[id number] was introduced through the anus                             and advanced to the the cecum, identified by                            appendiceal orifice and ileocecal valve. The                            colonoscopy was performed without difficulty. The                            Cobb tolerated the procedure well. The quality                            of the bowel preparation was excellent. The                            ileocecal valve, appendiceal orifice, and rectum                            were photographed. Scope In: 8:55:40 AM Scope Out: 9:11:30 AM Scope Withdrawal Time: 0 hours 14 minutes 28 seconds  Total Procedure Duration: 0 hours 15 minutes 50 seconds  Findings:                 The perianal and digital rectal examinations were                            normal.                           Three sessile polyps were found in the rectum and                            distal sigmoid colon. The polyps were 2 to 6 mm in                            size. These polyps were removed with a cold snare.  Resection and retrieval were complete.                           Multiple diverticula were found in the left colon.                           The exam was otherwise without abnormality on                            direct and retroflexion views. Complications:            No immediate complications. Estimated Blood Loss:     Estimated blood loss was minimal. Impression:               - Three 2 to 6 mm polyps in the rectum and in the                            distal sigmoid colon, removed with a cold snare.                            Resected and retrieved.                           - Diverticulosis in the left colon.                           - The examination was otherwise normal on direct                            and retroflexion views. Recommendation:           - Cobb has a contact number available for                            emergencies. The signs and symptoms of potential                             delayed complications were discussed with the                            Cobb. Return to normal activities tomorrow.                            Written discharge instructions were provided to the                            Cobb.                           - Resume previous diet.                           - Continue present medications.                           - Await pathology results.                           -  Repeat colonoscopy is recommended for                            surveillance. The colonoscopy date will be                            determined after pathology results from today's                            exam become available for review. Colin Cobb L. Colin Carrow, MD 06/21/2021 9:14:54 AM This report has been signed electronically.

## 2021-06-21 NOTE — Progress Notes (Signed)
Pt's states no medical or surgical changes since previsit or office visit. 

## 2021-06-21 NOTE — Progress Notes (Signed)
History and Physical:  This patient presents for endoscopic testing for: Encounter Diagnosis  Name Primary?   Personal history of colonic polyps Yes    Last colonoscopy 03/2018  ROS: Patient denies chest pain, dyspnea  or cough   Past Medical History: Past Medical History:  Diagnosis Date   Arthritis    knees   Fracture of leg    both legs 1982   GERD (gastroesophageal reflux disease)    with certain foods   Hypothyroidism    on meds   Leg fracture, right    Migraines    Tobacco abuse      Past Surgical History: Past Surgical History:  Procedure Laterality Date   APPENDECTOMY  1988   COLONOSCOPY  2019   HD-MAC-plenvu(good)-SSP/ TA   POLYPECTOMY  2019   SSP and TA   WISDOM TOOTH EXTRACTION      Allergies: No Known Allergies  Outpatient Meds: Current Outpatient Medications  Medication Sig Dispense Refill   levothyroxine (SYNTHROID) 112 MCG tablet TAKE 1 TABLET (112 MCG TOTAL) BY MOUTH DAILY BEFORE BREAKFAST. 90 tablet 3   Aspirin-Salicylamide-Caffeine (BC HEADACHE POWDER PO) Take 1 tablet by mouth daily as needed.     Current Facility-Administered Medications  Medication Dose Route Frequency Provider Last Rate Last Admin   0.9 %  sodium chloride infusion  500 mL Intravenous Once Danis, Estill Cotta III, MD          ___________________________________________________________________ Objective   Exam:  BP 134/81   Pulse 63   Temp 98.6 F (37 C) (Temporal)   Ht _0  (1.753 m)   Wt 184 lb (83.5 kg)   BMI 27.17 kg/m   CV: RRR without murmur, S1/S2 Resp: clear to auscultation bilaterally, normal RR and effort noted GI: soft, no tenderness, with active bowel sounds.   Assessment: Encounter Diagnosis  Name Primary?   Personal history of colonic polyps Yes     Plan: Colonoscopy  The benefits and risks of the planned procedure were described in detail with the patient or (when appropriate) their health care proxy.  Risks were outlined as including,  but not limited to, bleeding, infection, perforation, adverse medication reaction leading to cardiac or pulmonary decompensation, pancreatitis (if ERCP).  The limitation of incomplete mucosal visualization was also discussed.  No guarantees or warranties were given.    The patient is appropriate for an endoscopic procedure in the ambulatory setting.   - Wilfrid Lund, MD

## 2021-06-23 ENCOUNTER — Telehealth: Payer: Self-pay | Admitting: *Deleted

## 2021-06-23 NOTE — Telephone Encounter (Signed)
  Follow up Call-  Call back number 06/21/2021  Post procedure Call Back phone  # [phone]  Permission to leave phone message Yes  Some recent data might be hidden     Patient questions:  Do you have a fever, pain , or abdominal swelling? No. Pain Score  0 *  Have you tolerated food without any problems? Yes.    Have you been able to return to your normal activities? Yes.    Do you have any questions about your discharge instructions: Diet   No. Medications  No. Follow up visit  No.  Do you have questions or concerns about your Care? No.  Actions: * If pain score is 4 or above: No action needed, pain <4.  Have you developed a fever since your procedure? no  2.   Have you had an respiratory symptoms (SOB or cough) since your procedure? no  3.   Have you tested positive for COVID 19 since your procedure no  4.   Have you had any family members/close contacts diagnosed with the COVID 19 since your procedure?  no   If yes to any of these questions please route to Joylene John, RN and Joella Prince, RN

## 2021-06-27 ENCOUNTER — Encounter: Payer: Self-pay | Admitting: Gastroenterology

## 2021-11-17 ENCOUNTER — Telehealth: Payer: Self-pay | Admitting: Family Medicine

## 2021-11-17 NOTE — Telephone Encounter (Signed)
Pt called stating that he would like all of his medication going to [place]. Pt also states he would like 90 day  refills. ?

## 2021-11-20 NOTE — Telephone Encounter (Signed)
Please call patient and schedule a CPE. ?Last office visit 04/20/20. ?Send back to CMA for a refill after appointment has been scheduled ?

## 2021-12-15 NOTE — Telephone Encounter (Signed)
Pharmacy updated in patient's chart.  Should have refills available on his levothyroxine at [facility].  He can have them transfer remaining refills to [facility] [place].  ?

## 2021-12-15 NOTE — Telephone Encounter (Signed)
Spoke with Mr. Berent.  He will have his remaining refills transferred to [facility] [place].   ?

## 2021-12-15 NOTE — Telephone Encounter (Addendum)
Called pt scheduled CPE . Pt would like to change to [facility] in [place] ?

## 2022-01-15 ENCOUNTER — Encounter: Payer: Self-pay | Admitting: Family Medicine

## 2022-01-22 ENCOUNTER — Encounter: Payer: Self-pay | Admitting: Family Medicine

## 2022-02-05 ENCOUNTER — Encounter: Payer: Self-pay | Admitting: Family Medicine

## 2022-02-05 ENCOUNTER — Ambulatory Visit (INDEPENDENT_AMBULATORY_CARE_PROVIDER_SITE_OTHER): Payer: Commercial Managed Care - HMO | Admitting: Family Medicine

## 2022-02-05 VITALS — BP 130/80 | HR 60 | Temp 97.8°F | Ht 69.0 in | Wt 185.1 lb

## 2022-02-05 DIAGNOSIS — Z131 Encounter for screening for diabetes mellitus: Secondary | ICD-10-CM

## 2022-02-05 DIAGNOSIS — Z0000 Encounter for general adult medical examination without abnormal findings: Secondary | ICD-10-CM

## 2022-02-05 DIAGNOSIS — Z13220 Encounter for screening for lipoid disorders: Secondary | ICD-10-CM | POA: Diagnosis not present

## 2022-02-05 DIAGNOSIS — Z125 Encounter for screening for malignant neoplasm of prostate: Secondary | ICD-10-CM

## 2022-02-05 DIAGNOSIS — E038 Other specified hypothyroidism: Secondary | ICD-10-CM

## 2022-02-05 DIAGNOSIS — Z23 Encounter for immunization: Secondary | ICD-10-CM | POA: Diagnosis not present

## 2022-02-05 DIAGNOSIS — Z79899 Other long term (current) drug therapy: Secondary | ICD-10-CM | POA: Diagnosis not present

## 2022-02-05 LAB — CBC WITH DIFFERENTIAL/PLATELET
Basophils Absolute: 0 10*3/uL (ref 0.0–0.1)
Basophils Relative: 0.7 % (ref 0.0–3.0)
Eosinophils Absolute: 0.2 10*3/uL (ref 0.0–0.7)
Eosinophils Relative: 3.6 % (ref 0.0–5.0)
HCT: 43.4 % (ref 39.0–52.0)
Hemoglobin: 14.2 g/dL (ref 13.0–17.0)
Lymphocytes Relative: 29.8 % (ref 12.0–46.0)
Lymphs Abs: 1.9 10*3/uL (ref 0.7–4.0)
MCHC: 32.8 g/dL (ref 30.0–36.0)
MCV: 90.9 fl (ref 78.0–100.0)
Monocytes Absolute: 0.7 10*3/uL (ref 0.1–1.0)
Monocytes Relative: 11.1 % (ref 3.0–12.0)
Neutro Abs: 3.5 10*3/uL (ref 1.4–7.7)
Neutrophils Relative %: 54.8 % (ref 43.0–77.0)
Platelets: 264 10*3/uL (ref 150.0–400.0)
RBC: 4.77 Mil/uL (ref 4.22–5.81)
RDW: 13.6 % (ref 11.5–15.5)
WBC: 6.4 10*3/uL (ref 4.0–10.5)

## 2022-02-05 LAB — T3, FREE: T3, Free: 3 pg/mL (ref 2.3–4.2)

## 2022-02-05 LAB — HEPATIC FUNCTION PANEL
ALT: 9 U/L (ref 0–53)
AST: 11 U/L (ref 0–37)
Albumin: 4.6 g/dL (ref 3.5–5.2)
Alkaline Phosphatase: 51 U/L (ref 39–117)
Bilirubin, Direct: 0.1 mg/dL (ref 0.0–0.3)
Total Bilirubin: 0.4 mg/dL (ref 0.2–1.2)
Total Protein: 7.3 g/dL (ref 6.0–8.3)

## 2022-02-05 LAB — BASIC METABOLIC PANEL
BUN: 13 mg/dL (ref 6–23)
CO2: 28 mEq/L (ref 19–32)
Calcium: 9.5 mg/dL (ref 8.4–10.5)
Chloride: 105 mEq/L (ref 96–112)
Creatinine, Ser: 0.88 mg/dL (ref 0.40–1.50)
GFR: 96.41 mL/min (ref >60.00)
Glucose, Bld: 113 mg/dL — ABNORMAL HIGH (ref 70–99)
Potassium: 4.5 mEq/L (ref 3.5–5.1)
Sodium: 141 mEq/L (ref 135–145)

## 2022-02-05 LAB — LIPID PANEL
Cholesterol: 204 mg/dL — ABNORMAL HIGH (ref 0–200)
HDL: 40.6 mg/dL (ref >39.00)
LDL Cholesterol: 129 mg/dL — ABNORMAL HIGH (ref 0–99)
NonHDL: 163.64
Total CHOL/HDL Ratio: 5
Triglycerides: 174 mg/dL — ABNORMAL HIGH (ref 0.0–149.0)
VLDL: 34.8 mg/dL (ref 0.0–40.0)

## 2022-02-05 LAB — TSH: TSH: 4.02 u[IU]/mL (ref 0.35–5.50)

## 2022-02-05 LAB — T4, FREE: Free T4: 0.79 ng/dL (ref 0.60–1.60)

## 2022-02-05 LAB — HEMOGLOBIN A1C: Hgb A1c MFr Bld: 6.1 % (ref 4.6–6.5)

## 2022-02-05 MED ORDER — LEVOTHYROXINE SODIUM 112 MCG PO TABS: 112.0000 ug | ORAL_TABLET | Freq: Every day | ORAL | 3 refills | Status: DC

## 2022-02-05 NOTE — Progress Notes (Signed)
Zed Wanninger T. Ronn Smolinsky, MD, [place] at [facility] [place], [address]  Phone: [phone]  FAX: [place] - 56 y.o. male  MRN [id number]  Date of Birth: 22-Dec-1965  Date: 02/05/2022  PCP: Owens Loffler, MD  Referral: Owens Loffler, MD  Chief Complaint  Patient presents with   Annual Exam   Patient Care Team: Owens Loffler, MD as PCP - General Subjective:   Colin Cobb is a 56 y.o. pleasant patient who presents with the following:  Preventative Health Maintenance Visit:  Health Maintenance Summary Reviewed and updated, unless pt declines services.  Tobacco History Reviewed. 1/3 ppd Alcohol: No concerns, no excessive use Exercise Habits: walks some, but working with hands and going up and down stairs and ladders, gardening.  STD concerns: no risk or activity to increase risk Drug Use: None  Covid  Shingrix Tdap Flu shot - declines the rest of vaccines  Thyroid and basic labs - no missed doses  Doing painting, sheet rock, other stuff.  Health Maintenance  Topic Date Due   COVID-19 Vaccine (1) Never done   Zoster Vaccines- Shingrix (1 of 2) Never done   INFLUENZA VACCINE  04/03/2022   COLONOSCOPY (Pts 45-61yr Insurance coverage will need to be confirmed)  06/21/2026   TETANUS/TDAP  02/06/2032   Hepatitis C Screening  Completed   HIV Screening  Completed   HPV VACCINES  Aged Out   Immunization History  Administered Date(s) Administered   Td 09/03/2006   Tdap 02/05/2022   Patient Active Problem List   Diagnosis Date Noted   Hypothyroidism 12/27/2008   TOBACCO USE 12/27/2008   COMMON MIGRAINE 12/27/2008   GERD 12/27/2008    Past Medical History:  Diagnosis Date   Arthritis    knees   Fracture of leg    both legs 1982   GERD (gastroesophageal reflux disease)    with certain foods   Hypothyroidism    on meds   Leg fracture, right    Migraines    Tobacco  abuse     Past Surgical History:  Procedure Laterality Date   APPENDECTOMY  1988   COLONOSCOPY  2019   HD-MAC-plenvu(good)-SSP/ TA   POLYPECTOMY  2019   SSP and TA   WISDOM TOOTH EXTRACTION      Family History  Problem Relation Age of Onset   Breast cancer Maternal Grandmother    Heart disease Maternal Grandfather    Colon cancer Neg Hx    Colon polyps Neg Hx    Esophageal cancer Neg Hx    Rectal cancer Neg Hx    Stomach cancer Neg Hx     Social History   Social History Narrative   Not on file    Past Medical History, Surgical History, Social History, Family History, Problem List, Medications, and Allergies have been reviewed and updated if relevant.  Review of Systems: Pertinent positives are listed above.  Otherwise, a full 14 point review of systems has been done in full and it is negative except where it is noted positive.  Objective:   BP 130/80   Pulse 60   Temp 97.8 F (36.6 C) (Oral)   Ht _0  (1.753 m)   Wt 185 lb 2 oz (84 kg)   SpO2 96%   BMI 27.34 kg/m  Ideal Body Weight: Weight in (lb) to have BMI = 25: 168.9  Ideal Body Weight: Weight in (lb) to have BMI =  25: 168.9 No results found.    02/05/2022    8:15 AM 04/20/2020    8:32 AM 12/30/2017    2:31 PM  Depression screen PHQ 2/9  Decreased Interest 0 0 0  Down, Depressed, Hopeless 0 0 0  PHQ - 2 Score 0 0 0     GEN: well developed, well nourished, no acute distress Eyes: conjunctiva and lids normal, PERRLA, EOMI ENT: TM clear, nares clear, oral exam WNL Neck: supple, no lymphadenopathy, no thyromegaly, no JVD Pulm: clear to auscultation and percussion, respiratory effort normal CV: regular rate and rhythm, S1-S2, no murmur, rub or gallop, no bruits, peripheral pulses normal and symmetric, no cyanosis, clubbing, edema or varicosities GI: soft, non-tender; no hepatosplenomegaly, masses; active bowel sounds all quadrants GU: deferred Lymph: no cervical, axillary or inguinal adenopathy MSK:  gait normal, muscle tone and strength WNL, no joint swelling, effusions, discoloration, crepitus  SKIN: clear, good turgor, color WNL, no rashes, lesions, or ulcerations Neuro: normal mental status, normal strength, sensation, and motion Psych: alert; oriented to person, place and time, normally interactive and not anxious or depressed in appearance.  All labs reviewed with patient. Results for orders placed or performed in visit on 04/20/20  SARS CoV2 Serology(COVID19) AB(IgG,IgM),Immunoassay   Specimen: Blood  Result Value Ref Range   SARS CoV-2 AB IgG POSITIVE (A) NEGATIVE   SARS CoV-2 IgM POSITIVE (A) NEGATIVE  T3, free  Result Value Ref Range   T3, Free 3.4 2.3 - 4.2 pg/mL  T4, free  Result Value Ref Range   Free T4 0.76 0.60 - 1.60 ng/dL  TSH  Result Value Ref Range   TSH 3.21 0.35 - 4.50 uIU/mL  Basic metabolic panel  Result Value Ref Range   Sodium 141 135 - 145 mEq/L   Potassium 4.2 3.5 - 5.1 mEq/L   Chloride 106 96 - 112 mEq/L   CO2 29 19 - 32 mEq/L   Glucose, Bld 109 (H) 70 - 99 mg/dL   BUN 13 6 - 23 mg/dL   Creatinine, Ser 0.80 0.40 - 1.50 mg/dL   GFR 100.63 >60.00 mL/min   Calcium 9.4 8.4 - 10.5 mg/dL  CBC with Differential/Platelet  Result Value Ref Range   WBC 8.6 4.0 - 10.5 K/uL   RBC 4.65 4.22 - 5.81 Mil/uL   Hemoglobin 13.8 13.0 - 17.0 g/dL   HCT 42.7 39.0 - 52.0 %   MCV 91.7 78.0 - 100.0 fl   MCHC 32.3 30.0 - 36.0 g/dL   RDW 13.8 11.5 - 15.5 %   Platelets 245.0 150.0 - 400.0 K/uL   Neutrophils Relative % 63.4 43.0 - 77.0 %   Lymphocytes Relative 24.5 12.0 - 46.0 %   Monocytes Relative 9.7 3.0 - 12.0 %   Eosinophils Relative 1.6 0.0 - 5.0 %   Basophils Relative 0.8 0.0 - 3.0 %   Neutro Abs 5.5 1.4 - 7.7 K/uL   Lymphs Abs 2.1 0.7 - 4.0 K/uL   Monocytes Absolute 0.8 0.1 - 1.0 K/uL   Eosinophils Absolute 0.1 0.0 - 0.7 K/uL   Basophils Absolute 0.1 0.0 - 0.1 K/uL  Hepatic function panel  Result Value Ref Range   Total Bilirubin 0.4 0.2 - 1.2 mg/dL    Bilirubin, Direct 0.1 0.0 - 0.3 mg/dL   Alkaline Phosphatase 49 39 - 117 U/L   AST 11 0 - 37 U/L   ALT 8 0 - 53 U/L   Total Protein 7.2 6.0 - 8.3 g/dL   Albumin  4.7 3.5 - 5.2 g/dL  Lipid panel  Result Value Ref Range   Cholesterol 201 (H) 0 - 200 mg/dL   Triglycerides 134.0 0.0 - 149.0 mg/dL   HDL 47.00 >39.00 mg/dL   VLDL 26.8 0.0 - 40.0 mg/dL   LDL Cholesterol 128 (H) 0 - 99 mg/dL   Total CHOL/HDL Ratio 4    NonHDL 154.31   PSA, Total with Reflex to PSA, Free  Result Value Ref Range   PSA, Total 0.7 < OR = 4.0 ng/mL    Assessment and Plan:     ICD-10-CM   1. Healthcare maintenance  Z00.00 Tdap vaccine greater than or equal to 7yo IM    2. Other specified hypothyroidism  E03.8 T4, free    T3, free    TSH    3. Screening cholesterol level  Z13.220 Lipid panel    4. Screening for diabetes mellitus (DM)  V36.1 Basic metabolic panel    Hemoglobin A1c    5. Screening PSA (prostate specific antigen)  Z12.5 PSA, Total with Reflex to PSA, Free    6. Encounter for long-term (current) use of medications  Z79.899 CBC with Differential/Platelet    Hepatic function panel    7. 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 We talked about consideration of quitting smoking. Additionally, recommended a number of different other vaccines, but for now he wants to hold.  He continues to be active and eats well.  Up-to-date on other health maintenance.  Health Maintenance Exam: The patient's preventative maintenance and recommended screening tests for an annual wellness exam were reviewed in full today. Brought up to date unless services declined.  Counselled on the importance of diet, exercise, and its role in overall health and mortality. The patient's FH and SH was reviewed, including their home life, tobacco status, and drug and alcohol  status.  Follow-up in 1 year for physical exam or additional follow-up below.  Follow-up: No follow-ups on file. Or follow-up in 1 year if not noted.  No orders of the defined types were placed in this encounter.  Medications Discontinued During This Encounter  Medication Reason   Aspirin-Salicylamide-Caffeine (BC HEADACHE POWDER PO) No longer needed (for PRN medications)   Orders Placed This Encounter  Procedures   Tdap vaccine greater than or equal to 7yo IM   Basic metabolic panel   CBC with Differential/Platelet   Hepatic function panel   Hemoglobin A1c   Lipid panel   T4, free   T3, free   TSH   PSA, Total with Reflex to PSA, Free    Signed,  Rhen Dossantos T. Caleen Taaffe, MD   Allergies as of 02/05/2022   No Known Allergies      Medication List        Accurate as of February 05, 2022  9:11 AM. If you have any questions, ask your nurse or doctor.          STOP taking these medications    BC HEADACHE POWDER PO Stopped by: Owens Loffler, MD       TAKE these medications    levothyroxine 112 MCG tablet Commonly known as: SYNTHROID TAKE 1 TABLET (112 MCG TOTAL) BY MOUTH DAILY BEFORE BREAKFAST.

## 2022-02-05 NOTE — Addendum Note (Signed)
Addended by: Carter Kitten on: 02/05/2022 04:08 PM   Modules accepted: Orders

## 2022-02-06 LAB — PSA, TOTAL WITH REFLEX TO PSA, FREE: PSA, Total: 0.7 ng/mL (ref <=4.0)

## 2022-07-01 ENCOUNTER — Other Ambulatory Visit: Payer: Self-pay | Admitting: Family Medicine

## 2022-07-01 DIAGNOSIS — E038 Other specified hypothyroidism: Secondary | ICD-10-CM

## 2022-07-02 NOTE — Telephone Encounter (Signed)
Spoke with Colin Cobb to verify if he is using [facility] or [facility] since his last refill was sent into [facility] back in June.  He states he has only gotten one prescription from [facility] but will call them for a refill but until he can get the refill for them, he ask that I send in a [facility] day supply [facility].  [facility] requested.

## 2022-07-27 ENCOUNTER — Other Ambulatory Visit: Payer: Self-pay | Admitting: Family Medicine

## 2022-07-27 DIAGNOSIS — E038 Other specified hypothyroidism: Secondary | ICD-10-CM

## 2022-07-30 ENCOUNTER — Telehealth: Payer: Self-pay | Admitting: Family Medicine

## 2022-07-30 DIAGNOSIS — E038 Other specified hypothyroidism: Secondary | ICD-10-CM

## 2022-07-30 MED ORDER — LEVOTHYROXINE SODIUM 112 MCG PO TABS: 112.0000 ug | ORAL_TABLET | Freq: Every day | ORAL | 1 refills | Status: DC

## 2022-07-30 NOTE — Telephone Encounter (Signed)
Refill sent as requested. 

## 2022-07-30 NOTE — Telephone Encounter (Signed)
Caller Name: Nicklaus  Call back phone #: [phone]  MEDICATION(S):  levothyroxine (SYNTHROID) 112 MCG tablet [[id number]]   Days of Med Remaining: 3  Has the patient contacted their pharmacy (YES/NO)? YES What did pharmacy advise?  Pharmacy stated they sent a refill request   Preferred Pharmacy:  [facility] [address]   ~~~Please advise patient/caregiver to allow 2-3 business days to process RX refills.

## 2023-02-16 ENCOUNTER — Other Ambulatory Visit: Payer: Self-pay | Admitting: Family Medicine

## 2023-02-16 DIAGNOSIS — E038 Other specified hypothyroidism: Secondary | ICD-10-CM

## 2023-02-18 NOTE — Telephone Encounter (Signed)
Please schedule CPE with fasting labs prior with Dr. Copland.  

## 2023-02-18 NOTE — Telephone Encounter (Signed)
Spoke to pt, scheduled cpe for 04/11/23

## 2023-04-11 ENCOUNTER — Encounter: Payer: Commercial Managed Care - HMO | Admitting: Family Medicine

## 2023-05-15 ENCOUNTER — Other Ambulatory Visit: Payer: Self-pay | Admitting: Family Medicine

## 2023-05-15 DIAGNOSIS — E038 Other specified hypothyroidism: Secondary | ICD-10-CM

## 2023-06-25 NOTE — Progress Notes (Unsigned)
Colin Geffre T. Vint Pola, MD, CAQ Sports Medicine [facility] at [facility] [address] [place], [address]  Phone: [phone]  FAX: [phone]  Colin Cobb - 57 y.o. male  MRN [id number]  Date of Birth: 05/05/1966  Date: 06/26/2023  PCP: Hannah Beat, MD  Referral: Hannah Beat, MD  No chief complaint on file.  Patient Care Team: Hannah Beat, MD as PCP - General Subjective:   Colin Cobb is a 58 y.o. pleasant patient who presents with the following:  Preventative Health Maintenance Visit:  Health Maintenance Summary Reviewed and updated, unless pt declines services.  Tobacco History Reviewed. Alcohol: No concerns, no excessive use Exercise Habits: Some activity, rec at least 30 mins 5 times a week STD concerns: no risk or activity to increase risk Drug Use: None  Covid Shingrix Flu - order all labs  Health Maintenance  Topic Date Due   COVID-19 Vaccine (1) Never done   Zoster Vaccines- Shingrix (1 of 2) Never done   INFLUENZA VACCINE  Never done   Colonoscopy  06/21/2026   DTaP/Tdap/Td (3 - Td or Tdap) 02/06/2032   Hepatitis C Screening  Completed   HIV Screening  Completed   HPV VACCINES  Aged Out   Immunization History  Administered Date(s) Administered   Td 09/03/2006   Tdap 02/05/2022   Patient Active Problem List   Diagnosis Date Noted   Hypothyroidism 12/27/2008   TOBACCO USE 12/27/2008   COMMON MIGRAINE 12/27/2008   GERD 12/27/2008    Past Medical History:  Diagnosis Date   Arthritis    knees   Fracture of leg    both legs 1982   GERD (gastroesophageal reflux disease)    with certain foods   Hypothyroidism    on meds   Leg fracture, right    Migraines    Tobacco abuse     Past Surgical History:  Procedure Laterality Date   APPENDECTOMY  1988   COLONOSCOPY  2019   HD-MAC-plenvu(good)-SSP/ TA   POLYPECTOMY  2019   SSP and TA   WISDOM TOOTH EXTRACTION      Family History  Problem  Relation Age of Onset   Breast cancer Maternal Grandmother    Heart disease Maternal Grandfather    Colon cancer Neg Hx    Colon polyps Neg Hx    Esophageal cancer Neg Hx    Rectal cancer Neg Hx    Stomach cancer Neg Hx     Social History   Social History Narrative   Not on file    Past Medical History, Surgical History, Social History, Family History, Problem List, Medications, and Allergies have been reviewed and updated if relevant.  Review of Systems: Pertinent positives are listed above.  Otherwise, a full 14 point review of systems has been done in full and it is negative except where it is noted positive.  Objective:   There were no vitals taken for this visit. Ideal Body Weight:    Ideal Body Weight:   No results found.    02/05/2022    8:15 AM 04/20/2020    8:32 AM 12/30/2017    2:31 PM  Depression screen PHQ 2/9  Decreased Interest 0 0 0  Down, Depressed, Hopeless 0 0 0  PHQ - 2 Score 0 0 0     GEN: well developed, well nourished, no acute distress Eyes: conjunctiva and lids normal, PERRLA, EOMI ENT: TM clear, nares clear, oral exam WNL Neck: supple, no lymphadenopathy,  no thyromegaly, no JVD Pulm: clear to auscultation and percussion, respiratory effort normal CV: regular rate and rhythm, S1-S2, no murmur, rub or gallop, no bruits, peripheral pulses normal and symmetric, no cyanosis, clubbing, edema or varicosities GI: soft, non-tender; no hepatosplenomegaly, masses; active bowel sounds all quadrants GU: deferred Lymph: no cervical, axillary or inguinal adenopathy MSK: gait normal, muscle tone and strength WNL, no joint swelling, effusions, discoloration, crepitus  SKIN: clear, good turgor, color WNL, no rashes, lesions, or ulcerations Neuro: normal mental status, normal strength, sensation, and motion Psych: alert; oriented to person, place and time, normally interactive and not anxious or depressed in appearance.  All labs reviewed with patient. Results  for orders placed or performed in visit on 02/05/22  Basic metabolic panel  Result Value Ref Range   Sodium 141 135 - 145 mEq/L   Potassium 4.5 3.5 - 5.1 mEq/L   Chloride 105 96 - 112 mEq/L   CO2 28 19 - 32 mEq/L   Glucose, Bld 113 (H) 70 - 99 mg/dL   BUN 13 6 - 23 mg/dL   Creatinine, Ser 2.95 0.40 - 1.50 mg/dL   GFR 62.13 >08.65 mL/min   Calcium 9.5 8.4 - 10.5 mg/dL  CBC with Differential/Platelet  Result Value Ref Range   WBC 6.4 4.0 - 10.5 K/uL   RBC 4.77 4.22 - 5.81 Mil/uL   Hemoglobin 14.2 13.0 - 17.0 g/dL   HCT 78.4 69.6 - 29.5 %   MCV 90.9 78.0 - 100.0 fl   MCHC 32.8 30.0 - 36.0 g/dL   RDW 28.4 13.2 - 44.0 %   Platelets 264.0 150.0 - 400.0 K/uL   Neutrophils Relative % 54.8 43.0 - 77.0 %   Lymphocytes Relative 29.8 12.0 - 46.0 %   Monocytes Relative 11.1 3.0 - 12.0 %   Eosinophils Relative 3.6 0.0 - 5.0 %   Basophils Relative 0.7 0.0 - 3.0 %   Neutro Abs 3.5 1.4 - 7.7 K/uL   Lymphs Abs 1.9 0.7 - 4.0 K/uL   Monocytes Absolute 0.7 0.1 - 1.0 K/uL   Eosinophils Absolute 0.2 0.0 - 0.7 K/uL   Basophils Absolute 0.0 0.0 - 0.1 K/uL  Hepatic function panel  Result Value Ref Range   Total Bilirubin 0.4 0.2 - 1.2 mg/dL   Bilirubin, Direct 0.1 0.0 - 0.3 mg/dL   Alkaline Phosphatase 51 39 - 117 U/L   AST 11 0 - 37 U/L   ALT 9 0 - 53 U/L   Total Protein 7.3 6.0 - 8.3 g/dL   Albumin 4.6 3.5 - 5.2 g/dL  Hemoglobin N0U  Result Value Ref Range   Hgb A1c MFr Bld 6.1 4.6 - 6.5 %  Lipid panel  Result Value Ref Range   Cholesterol 204 (H) 0 - 200 mg/dL   Triglycerides 725.3 (H) 0.0 - 149.0 mg/dL   HDL 66.44 >03.47 mg/dL   VLDL 42.5 0.0 - 95.6 mg/dL   LDL Cholesterol 387 (H) 0 - 99 mg/dL   Total CHOL/HDL Ratio 5    NonHDL 163.64   T4, free  Result Value Ref Range   Free T4 0.79 0.60 - 1.60 ng/dL  T3, free  Result Value Ref Range   T3, Free 3.0 2.3 - 4.2 pg/mL  TSH  Result Value Ref Range   TSH 4.02 0.35 - 5.50 uIU/mL  PSA, Total with Reflex to PSA, Free  Result Value  Ref Range   PSA, Total 0.7 < OR = 4.0 ng/mL    Assessment and  Plan:     ICD-10-CM   1. Healthcare maintenance  Z00.00       Health Maintenance Exam: The patient's preventative maintenance and recommended screening tests for an annual wellness exam were reviewed in full today. Brought up to date unless services declined.  Counselled on the importance of diet, exercise, and its role in overall health and mortality. The patient's FH and SH was reviewed, including their home life, tobacco status, and drug and alcohol status.  Follow-up in 1 year for physical exam or additional follow-up below.  Disposition: No follow-ups on file.  No orders of the defined types were placed in this encounter.  There are no discontinued medications. No orders of the defined types were placed in this encounter.   Signed,  Elpidio Galea. Cydne Grahn, MD   Allergies as of 06/26/2023   No Known Allergies      Medication List        Accurate as of June 25, 2023  7:15 AM. If you have any questions, ask your nurse or doctor.          levothyroxine 112 MCG tablet Commonly known as: SYNTHROID TAKE 1 TABLET BY MOUTH EVERY DAY BEFORE BREAKFAST

## 2023-06-26 ENCOUNTER — Encounter: Payer: Self-pay | Admitting: Family Medicine

## 2023-06-26 ENCOUNTER — Ambulatory Visit (INDEPENDENT_AMBULATORY_CARE_PROVIDER_SITE_OTHER): Payer: Managed Care, Other (non HMO) | Admitting: Family Medicine

## 2023-06-26 VITALS — BP 118/66 | HR 63 | Temp 97.8°F | Ht 68.5 in | Wt 185.2 lb

## 2023-06-26 DIAGNOSIS — Z125 Encounter for screening for malignant neoplasm of prostate: Secondary | ICD-10-CM | POA: Diagnosis not present

## 2023-06-26 DIAGNOSIS — E038 Other specified hypothyroidism: Secondary | ICD-10-CM | POA: Diagnosis not present

## 2023-06-26 DIAGNOSIS — Z13220 Encounter for screening for lipoid disorders: Secondary | ICD-10-CM | POA: Diagnosis not present

## 2023-06-26 DIAGNOSIS — Z0000 Encounter for general adult medical examination without abnormal findings: Secondary | ICD-10-CM | POA: Diagnosis not present

## 2023-06-26 DIAGNOSIS — Z131 Encounter for screening for diabetes mellitus: Secondary | ICD-10-CM | POA: Diagnosis not present

## 2023-06-26 DIAGNOSIS — Z79899 Other long term (current) drug therapy: Secondary | ICD-10-CM

## 2023-06-26 LAB — HEPATIC FUNCTION PANEL
ALT: 8 U/L (ref 0–53)
AST: 11 U/L (ref 0–37)
Albumin: 4.4 g/dL (ref 3.5–5.2)
Alkaline Phosphatase: 47 U/L (ref 39–117)
Bilirubin, Direct: 0.1 mg/dL (ref 0.0–0.3)
Total Bilirubin: 0.3 mg/dL (ref 0.2–1.2)
Total Protein: 6.9 g/dL (ref 6.0–8.3)

## 2023-06-26 LAB — BASIC METABOLIC PANEL
BUN: 18 mg/dL (ref 6–23)
CO2: 28 meq/L (ref 19–32)
Calcium: 9.3 mg/dL (ref 8.4–10.5)
Chloride: 107 meq/L (ref 96–112)
Creatinine, Ser: 0.81 mg/dL (ref 0.40–1.50)
GFR: 97.9 mL/min (ref >60.00)
Glucose, Bld: 97 mg/dL (ref 70–99)
Potassium: 4.5 meq/L (ref 3.5–5.1)
Sodium: 140 meq/L (ref 135–145)

## 2023-06-26 LAB — HEMOGLOBIN A1C: Hgb A1c MFr Bld: 6.2 % (ref 4.6–6.5)

## 2023-06-26 LAB — TSH: TSH: 3.59 u[IU]/mL (ref 0.35–5.50)

## 2023-06-26 LAB — LIPID PANEL
Cholesterol: 189 mg/dL (ref 0–200)
HDL: 42.8 mg/dL (ref >39.00)
LDL Cholesterol: 119 mg/dL — ABNORMAL HIGH (ref 0–99)
NonHDL: 146.27
Total CHOL/HDL Ratio: 4
Triglycerides: 136 mg/dL (ref 0.0–149.0)
VLDL: 27.2 mg/dL (ref 0.0–40.0)

## 2023-06-26 LAB — CBC WITH DIFFERENTIAL/PLATELET
Basophils Absolute: 0.1 10*3/uL (ref 0.0–0.1)
Basophils Relative: 1.2 % (ref 0.0–3.0)
Eosinophils Absolute: 0.2 10*3/uL (ref 0.0–0.7)
Eosinophils Relative: 3.9 % (ref 0.0–5.0)
HCT: 43.6 % (ref 39.0–52.0)
Hemoglobin: 13.9 g/dL (ref 13.0–17.0)
Lymphocytes Relative: 35.1 % (ref 12.0–46.0)
Lymphs Abs: 2 10*3/uL (ref 0.7–4.0)
MCHC: 31.8 g/dL (ref 30.0–36.0)
MCV: 91.3 fL (ref 78.0–100.0)
Monocytes Absolute: 0.7 10*3/uL (ref 0.1–1.0)
Monocytes Relative: 11.9 % (ref 3.0–12.0)
Neutro Abs: 2.8 10*3/uL (ref 1.4–7.7)
Neutrophils Relative %: 47.9 % (ref 43.0–77.0)
Platelets: 261 10*3/uL (ref 150.0–400.0)
RBC: 4.77 Mil/uL (ref 4.22–5.81)
RDW: 13.7 % (ref 11.5–15.5)
WBC: 5.8 10*3/uL (ref 4.0–10.5)

## 2023-06-26 LAB — T3, FREE: T3, Free: 3.2 pg/mL (ref 2.3–4.2)

## 2023-06-26 LAB — T4, FREE: Free T4: 0.78 ng/dL (ref 0.60–1.60)

## 2023-06-26 MED ORDER — LEVOTHYROXINE SODIUM 112 MCG PO TABS: 112.0000 ug | ORAL_TABLET | Freq: Every day | ORAL | 3 refills | Status: DC

## 2023-06-27 LAB — PSA, TOTAL WITH REFLEX TO PSA, FREE: PSA, Total: 0.8 ng/mL (ref <=4.0)

## 2024-07-05 ENCOUNTER — Other Ambulatory Visit: Payer: Self-pay | Admitting: Family Medicine

## 2024-07-05 DIAGNOSIS — E038 Other specified hypothyroidism: Secondary | ICD-10-CM

## 2024-07-06 NOTE — Telephone Encounter (Signed)
 E-scribed refill.  Pls schedule CPE and fasting labs for additional refills.

## 2024-08-05 ENCOUNTER — Encounter: Payer: Self-pay | Admitting: Family Medicine

## 2024-09-07 ENCOUNTER — Encounter: Payer: Self-pay | Admitting: Family Medicine

## 2024-09-23 ENCOUNTER — Encounter: Payer: Self-pay | Admitting: Family Medicine

## 2024-10-01 ENCOUNTER — Other Ambulatory Visit: Payer: Self-pay | Admitting: Family Medicine

## 2024-10-01 DIAGNOSIS — E038 Other specified hypothyroidism: Secondary | ICD-10-CM

## 2024-10-05 ENCOUNTER — Encounter: Payer: Self-pay | Admitting: Family Medicine

## 2024-10-21 ENCOUNTER — Encounter: Payer: Self-pay | Admitting: Family Medicine
